# Patient Record
Sex: Female | Born: 1980 | Race: White | Hispanic: No | Marital: Single | Smoking: Current some day smoker
Health system: Southern US, Community
[De-identification: ages and names within clinical notes are randomized; demographics above are authoritative.]

## PROBLEM LIST (undated history)

## (undated) DIAGNOSIS — Z889 Allergy status to unspecified drugs, medicaments and biological substances status: Secondary | ICD-10-CM

## (undated) DIAGNOSIS — G43909 Migraine, unspecified, not intractable, without status migrainosus: Secondary | ICD-10-CM

## (undated) DIAGNOSIS — Z8659 Personal history of other mental and behavioral disorders: Secondary | ICD-10-CM

## (undated) DIAGNOSIS — F419 Anxiety disorder, unspecified: Secondary | ICD-10-CM

## (undated) DIAGNOSIS — F32A Depression, unspecified: Secondary | ICD-10-CM

## (undated) DIAGNOSIS — F329 Major depressive disorder, single episode, unspecified: Secondary | ICD-10-CM

## (undated) HISTORY — DX: Migraine, unspecified, not intractable, without status migrainosus: G43.909

## (undated) HISTORY — DX: Allergy status to unspecified drugs, medicaments and biological substances: Z88.9

## (undated) HISTORY — DX: Major depressive disorder, single episode, unspecified: F32.9

## (undated) HISTORY — DX: Anxiety disorder, unspecified: F41.9

## (undated) HISTORY — PX: OTHER SURGICAL HISTORY: SHX169

## (undated) HISTORY — DX: Personal history of other mental and behavioral disorders: Z86.59

## (undated) HISTORY — DX: Depression, unspecified: F32.A

---
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
## 2001-06-03 ENCOUNTER — Other Ambulatory Visit: Admission: RE | Admit: 2001-06-03 | Discharge: 2001-06-03 | Payer: Self-pay | Admitting: Gynecology

## 2002-07-05 ENCOUNTER — Other Ambulatory Visit: Admission: RE | Admit: 2002-07-05 | Discharge: 2002-07-05 | Payer: Self-pay | Admitting: Gynecology

## 2003-08-09 ENCOUNTER — Other Ambulatory Visit: Admission: RE | Admit: 2003-08-09 | Discharge: 2003-08-09 | Payer: Self-pay | Admitting: Obstetrics and Gynecology

## 2006-09-16 ENCOUNTER — Other Ambulatory Visit: Admission: RE | Admit: 2006-09-16 | Discharge: 2006-09-16 | Payer: Self-pay | Admitting: Obstetrics and Gynecology

## 2009-10-03 ENCOUNTER — Emergency Department (HOSPITAL_COMMUNITY): Admission: EM | Admit: 2009-10-03 | Discharge: 2009-10-03 | Payer: Self-pay | Admitting: Emergency Medicine

## 2010-06-01 LAB — CBC
HCT: 39.8 % (ref 36.0–46.0)
Hemoglobin: 13.2 g/dL (ref 12.0–15.0)
MCV: 86.9 fL (ref 78.0–100.0)
RDW: 13.5 % (ref 11.5–15.5)
WBC: 7.4 10*3/uL (ref 4.0–10.5)

## 2010-06-01 LAB — URINALYSIS, ROUTINE W REFLEX MICROSCOPIC
Bilirubin Urine: NEGATIVE
Hgb urine dipstick: NEGATIVE
Protein, ur: NEGATIVE mg/dL
Specific Gravity, Urine: 1.018 (ref 1.005–1.030)
Urobilinogen, UA: 0.2 mg/dL (ref 0.0–1.0)

## 2010-06-01 LAB — COMPREHENSIVE METABOLIC PANEL
Alkaline Phosphatase: 75 U/L (ref 39–117)
BUN: 12 mg/dL (ref 6–23)
CO2: 24 mEq/L (ref 19–32)
GFR calc non Af Amer: >60 mL/min (ref >60)
Glucose, Bld: 84 mg/dL (ref 70–99)
Potassium: 3.8 mEq/L (ref 3.5–5.1)
Total Bilirubin: 0.3 mg/dL (ref 0.3–1.2)
Total Protein: 7.1 g/dL (ref 6.0–8.3)

## 2010-06-01 LAB — DIFFERENTIAL
Basophils Absolute: 0 10*3/uL (ref 0.0–0.1)
Basophils Relative: 1 % (ref 0–1)
Eosinophils Absolute: 0.2 10*3/uL (ref 0.0–0.7)
Monocytes Relative: 6 % (ref 3–12)
Neutro Abs: 4.6 10*3/uL (ref 1.7–7.7)
Neutrophils Relative %: 62 % (ref 43–77)

## 2010-06-01 LAB — GC/CHLAMYDIA PROBE AMP, GENITAL
Chlamydia, DNA Probe: NEGATIVE
GC Probe Amp, Genital: NEGATIVE

## 2013-07-18 ENCOUNTER — Ambulatory Visit (INDEPENDENT_AMBULATORY_CARE_PROVIDER_SITE_OTHER): Payer: PRIVATE HEALTH INSURANCE | Admitting: Emergency Medicine

## 2013-07-18 VITALS — BP 110/78 | HR 104 | Temp 98.0°F | Ht 63.0 in | Wt 185.6 lb

## 2013-07-18 DIAGNOSIS — S139XXA Sprain of joints and ligaments of unspecified parts of neck, initial encounter: Secondary | ICD-10-CM

## 2013-07-18 DIAGNOSIS — R1011 Right upper quadrant pain: Secondary | ICD-10-CM

## 2013-07-18 MED ORDER — CYCLOBENZAPRINE HCL 10 MG PO TABS: 10.0000 mg | ORAL_TABLET | Freq: Three times a day (TID) | ORAL | Status: DC | PRN

## 2013-07-18 MED ORDER — NAPROXEN SODIUM 550 MG PO TABS: 550.0000 mg | ORAL_TABLET | Freq: Two times a day (BID) | ORAL | Status: DC

## 2013-07-18 NOTE — Patient Instructions (Signed)
Cholecystitis Cholecystitis is an inflammation of your gallbladder. It is usually caused by a buildup of gallstones or sludge (cholelithiasis) in your gallbladder. The gallbladder stores a fluid that helps digest fats (bile). Cholecystitis is serious and needs treatment right away.  CAUSES   Gallstones. Gallstones can block the tube that leads to your gallbladder, causing bile to build up. As bile builds up, the gallbladder becomes inflamed.  Bile duct problems, such as blockage from scarring or kinking.  Tumors. Tumors can stop bile from leaving your gallbladder correctly, causing bile to build up. As bile builds up, the gallbladder becomes inflamed. SYMPTOMS   Nausea.  Vomiting.  Abdominal pain, especially in the upper right area of your abdomen.  Abdominal tenderness or bloating.  Sweating.  Chills.  Fever.  Yellowing of the skin and the whites of the eyes (jaundice). DIAGNOSIS  Your caregiver may order blood tests to look for infection or gallbladder problems. Your caregiver may also order imaging tests, such as an ultrasound or computed tomography (CT) scan. Further tests may include a hepatobiliary iminodiacetic acid (HIDA) scan. This scan allows your caregiver to see your bile move from the liver to the gallbladder and to the small intestine. TREATMENT  A hospital stay is usually necessary to lessen the inflammation of your gallbladder. You may be required to not eat or drink (fast) for a certain amount of time. You may be given medicine to treat pain or an antibiotic medicine to treat an infection. Surgery may be needed to remove your gallbladder (cholecystectomy) once the inflammation has gone down. Surgery may be needed right away if you develop complications such as death of gallbladder tissue (gangrene) or a tear (perforation) of the gallbladder.  HOME CARE INSTRUCTIONS  Home care will depend on your treatment. In general:  If you were given antibiotics, take them as  directed. Finish them even if you start to feel better.  Only take over-the-counter or prescription medicines for pain, discomfort, or fever as directed by your caregiver.  Follow a low-fat diet until you see your caregiver again.  Keep all follow-up visits as directed by your caregiver. SEEK IMMEDIATE MEDICAL CARE IF:   Your pain is increasing and not controlled by medicines.  Your pain moves to another part of your abdomen or to your back.  You have a fever.  You have nausea and vomiting. MAKE SURE YOU:  Understand these instructions.  Will watch your condition.  Will get help right away if you are not doing well or get worse. Document Released: 03/03/2005 Document Revised: 05/26/2011 Document Reviewed: 01/17/2011 [facility] Patient Information 2014 [place], [place]. Cervical Sprain A cervical sprain is an injury in the neck in which the strong, fibrous tissues (ligaments) that connect your neck bones stretch or tear. Cervical sprains can range from mild to severe. Severe cervical sprains can cause the neck vertebrae to be unstable. This can lead to damage of the spinal cord and can result in serious nervous system problems. The amount of time it takes for a cervical sprain to get better depends on the cause and extent of the injury. Most cervical sprains heal in 1 to 3 weeks. CAUSES  Severe cervical sprains may be caused by:   Contact sport injuries (such as from football, rugby, wrestling, hockey, auto racing, gymnastics, diving, martial arts, or boxing).   Motor vehicle collisions.   Whiplash injuries. This is an injury from a sudden forward-and backward whipping movement of the head and neck.  Falls.  Mild cervical  sprains may be caused by:   Being in an awkward position, such as while cradling a telephone between your ear and shoulder.   Sitting in a chair that does not offer proper support.   Working at a poorly Marketing executivedesigned computer station.   Looking up or down  for long periods of time.  SYMPTOMS   Pain, soreness, stiffness, or a burning sensation in the front, back, or sides of the neck. This discomfort may develop immediately after the injury or slowly, 24 hours or more after the injury.   Pain or tenderness directly in the middle of the back of the neck.   Shoulder or upper back pain.   Limited ability to move the neck.   Headache.   Dizziness.   Weakness, numbness, or tingling in the hands or arms.   Muscle spasms.   Difficulty swallowing or chewing.   Tenderness and swelling of the neck.  DIAGNOSIS  Most of the time your health care provider can diagnose a cervical sprain by taking your history and doing a physical exam. Your health care provider will ask about previous neck injuries and any known neck problems, such as arthritis in the neck. X-rays may be taken to find out if there are any other problems, such as with the bones of the neck. Other tests, such as a CT scan or MRI, may also be needed.  TREATMENT  Treatment depends on the severity of the cervical sprain. Mild sprains can be treated with rest, keeping the neck in place (immobilization), and pain medicines. Severe cervical sprains are immediately immobilized. Further treatment is done to help with pain, muscle spasms, and other symptoms and may include:  Medicines, such as pain relievers, numbing medicines, or muscle relaxants.   Physical therapy. This may involve stretching exercises, strengthening exercises, and posture training. Exercises and improved posture can help stabilize the neck, strengthen muscles, and help stop symptoms from returning.  HOME CARE INSTRUCTIONS   Put ice on the injured area.   Put ice in a plastic bag.   Place a towel between your skin and the bag.   Leave the ice on for 15 20 minutes, 3 4 times a day.   If your injury was severe, you may have been given a cervical collar to wear. A cervical collar is a two-piece collar  designed to keep your neck from moving while it heals.  Do not remove the collar unless instructed by your health care provider.  If you have long hair, keep it outside of the collar.  Ask your health care provider before making any adjustments to your collar. Minor adjustments may be required over time to improve comfort and reduce pressure on your chin or on the back of your head.  Ifyou are allowed to remove the collar for cleaning or bathing, follow your health care provider's instructions on how to do so safely.  Keep your collar clean by wiping it with mild soap and water and drying it completely. If the collar you have been given includes removable pads, remove them every 1 2 days and hand wash them with soap and water. Allow them to air dry. They should be completely dry before you wear them in the collar.  If you are allowed to remove the collar for cleaning and bathing, wash and dry the skin of your neck. Check your skin for irritation or sores. If you see any, tell your health care provider.  Do not drive while wearing the collar.  Only take over-the-counter or prescription medicines for pain, discomfort, or fever as directed by your health care provider.   Keep all follow-up appointments as directed by your health care provider.   Keep all physical therapy appointments as directed by your health care provider.   Make any needed adjustments to your workstation to promote good posture.   Avoid positions and activities that make your symptoms worse.   Warm up and stretch before being active to help prevent problems.  SEEK MEDICAL CARE IF:   Your pain is not controlled with medicine.   You are unable to decrease your pain medicine over time as planned.   Your activity level is not improving as expected.  SEEK IMMEDIATE MEDICAL CARE IF:   You develop any bleeding.  You develop stomach upset.  You have signs of an allergic reaction to your medicine.   Your  symptoms get worse.   You develop new, unexplained symptoms.   You have numbness, tingling, weakness, or paralysis in any part of your body.  MAKE SURE YOU:   Understand these instructions.  Will watch your condition.  Will get help right away if you are not doing well or get worse. Document Released: 12/29/2006 Document Revised: 12/22/2012 Document Reviewed: 09/08/2012 [facility] Patient Information 2014 [place], [place].
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
## 2013-07-22 ENCOUNTER — Ambulatory Visit
Admission: RE | Admit: 2013-07-22 | Discharge: 2013-07-22 | Disposition: A | Discharge status: Home / Self Care | Payer: No Typology Code available for payment source | Source: Ambulatory Visit | Attending: Emergency Medicine | Admitting: Emergency Medicine

## 2013-07-22 DIAGNOSIS — R1011 Right upper quadrant pain: Secondary | ICD-10-CM

## 2013-07-26 ENCOUNTER — Other Ambulatory Visit: Payer: Self-pay | Admitting: Emergency Medicine

## 2013-07-26 DIAGNOSIS — R1011 Right upper quadrant pain: Secondary | ICD-10-CM

## 2013-08-04 ENCOUNTER — Encounter (HOSPITAL_COMMUNITY)
Admission: RE | Admit: 2013-08-04 | Discharge: 2013-08-04 | Disposition: A | Discharge status: Home / Self Care | Payer: No Typology Code available for payment source | Source: Ambulatory Visit | Attending: Emergency Medicine | Admitting: Emergency Medicine

## 2013-08-04 ENCOUNTER — Other Ambulatory Visit: Payer: Self-pay | Admitting: Emergency Medicine

## 2013-08-04 DIAGNOSIS — R1012 Left upper quadrant pain: Secondary | ICD-10-CM

## 2013-08-04 DIAGNOSIS — R1011 Right upper quadrant pain: Secondary | ICD-10-CM | POA: Insufficient documentation

## 2013-08-04 MED ORDER — SINCALIDE 5 MCG IJ SOLR
INTRAMUSCULAR | Status: AC
  Filled 2013-08-04: qty 5

## 2013-08-04 MED ORDER — STERILE WATER FOR INJECTION IJ SOLN
INTRAMUSCULAR | Status: AC
  Filled 2013-08-04: qty 10

## 2013-08-04 MED ORDER — SINCALIDE 5 MCG IJ SOLR
0.0200 ug/kg | Freq: Once | INTRAMUSCULAR | Status: AC
  Administered 2013-08-04: 1.68 ug via INTRAVENOUS

## 2013-08-04 MED ORDER — TECHNETIUM TC 99M MEBROFENIN IV KIT
5.0000 | PACK | Freq: Once | INTRAVENOUS | Status: AC | PRN
  Administered 2013-08-04: 5 via INTRAVENOUS

## 2013-08-10 ENCOUNTER — Ambulatory Visit (INDEPENDENT_AMBULATORY_CARE_PROVIDER_SITE_OTHER): Payer: PRIVATE HEALTH INSURANCE | Admitting: Family Medicine

## 2013-08-10 ENCOUNTER — Telehealth: Payer: Self-pay

## 2013-08-10 VITALS — BP 124/76 | HR 88 | Temp 99.4°F | Resp 16 | Ht 63.0 in | Wt 187.0 lb

## 2013-08-10 DIAGNOSIS — M25532 Pain in left wrist: Secondary | ICD-10-CM

## 2013-08-10 DIAGNOSIS — M25539 Pain in unspecified wrist: Secondary | ICD-10-CM

## 2013-08-10 NOTE — Progress Notes (Signed)
   Subjective:    Patient ID: Angela Price, female    DOB: 30-Oct-1980, 33 y.o.   MRN: [id number]  HPI Patient presents with left arm pain. Had IV last Thursday for NM hepatobiliary scan. Took 6 tries to get IV in. Felt like there was a hook in left hand the whole time the IV was in. During infusion it felt like fire up her arm. Arm still feels painful and cold. After procedure and IV removal there was a "lump" under her skin. Still having pain on dorsum of pain and wrist. Veins still will bulge. Dorsal forearm feels cold. Still lump on dorsal hand and wrist.   Patient works as Teacher, adult education.  Review of Systems  All other systems reviewed and are negative.      Objective:   Physical Exam  Constitutional: She is oriented to person, place, and time. She appears well-developed and well-nourished. No distress.  HENT:  Head: Normocephalic and atraumatic.  Eyes: Conjunctivae are normal. No scleral icterus.  Cardiovascular: Normal rate.   Pulmonary/Chest: Effort normal.  Musculoskeletal:       Left wrist: She exhibits tenderness. She exhibits normal range of motion, no bony tenderness, no swelling, no effusion and no deformity.  Neurological: She is alert and oriented to person, place, and time.  Skin: Skin is warm and dry. She is not diaphoretic.  Psychiatric: She has a normal mood and affect. Her behavior is normal.  Left wrist: FROM. 5/5 Strength radial, ulnar dev, flex, exten. Mild TTP dorsal hand. Moderate TTP dorsal wrist area of radioulnar joint. Mild TTP dorsal forearm. Normal grip strength. No swelling, echymosis     Assessment & Plan:  #1. Left wrist pain - Unclear etiology, may be related to IV trauma vs. Heavy work as Teacher, adult education - No risk for bony trauma so will hold off on xray at this time - No sign of swelling or thrombophlebitis - Wrist brace - Ibuprofen 600 mg tid x 5 days - Ice bucket soaks 10 min tid - Out of work until Monday for rest - F/u Tues if still  having pain Monday, would consider xray at that time.

## 2013-08-10 NOTE — Telephone Encounter (Signed)
LM for pt- advised her to come into the office to be evaluated or call the cardiologist office where the IV was placed.

## 2013-08-10 NOTE — Patient Instructions (Signed)
Thank you for coming in today  Out of work until Monday. Rest hand and wrist Wear wrist brace until then Ice bucket soaks 10 minutes 3x per day Ibuprofen 600 mg (3 tablets) 3x per day for 5 days then as needed  Followup Tuesday if not improved  Wrist Pain Wrist injuries are frequent in adults and children. A sprain is an injury to the ligaments that hold your bones together. A strain is an injury to muscle or muscle cord-like structures (tendons) from stretching or pulling. Generally, when wrists are moderately tender to touch following a fall or injury, a break in the bone (fracture) may be present. Most wrist sprains or strains are better in 3 to 5 days, but complete healing may take several weeks. HOME CARE INSTRUCTIONS   Put ice on the injured area.  Put ice in a plastic bag.  Place a towel between your skin and the bag.  Leave the ice on for 15-20 minutes, 03-04 times a day, for the first 2 days.  Keep your arm raised above the level of your heart whenever possible to reduce swelling and pain.  Rest the injured area for at least 48 hours or as directed by your caregiver.  If a splint or elastic bandage has been applied, use it for as long as directed by your caregiver or until seen by a caregiver for a follow-up exam.  Only take over-the-counter or prescription medicines for pain, discomfort, or fever as directed by your caregiver.  Keep all follow-up appointments. You may need to follow up with a specialist or have follow-up X-rays. Improvement in pain level is not a guarantee that you did not fracture a bone in your wrist. The only way to determine whether or not you have a broken bone is by X-ray. SEEK IMMEDIATE MEDICAL CARE IF:   Your fingers are swollen, very red, white, or cold and blue.  Your fingers are numb or tingling.  You have increasing pain.  You have difficulty moving your fingers. MAKE SURE YOU:   Understand these instructions.  Will watch your  condition.  Will get help right away if you are not doing well or get worse. Document Released: 12/11/2004 Document Revised: 05/26/2011 Document Reviewed: 04/24/2010 [facility] Patient Information 2014 [place], [place].

## 2013-08-10 NOTE — Telephone Encounter (Signed)
Patient states that her wrist is hurting really bad from where the IV was placed during her nuclear medicine scan. Patient states she can hardly move her wrist and wants to know what she can do about this. Please return call and advise.

## 2013-08-11 NOTE — Telephone Encounter (Signed)
Pt advised to come in and be evaluated. She states she will call cardiologist office.

## 2013-08-12 ENCOUNTER — Ambulatory Visit (INDEPENDENT_AMBULATORY_CARE_PROVIDER_SITE_OTHER): Payer: No Typology Code available for payment source | Admitting: Surgery

## 2013-10-10 ENCOUNTER — Ambulatory Visit (INDEPENDENT_AMBULATORY_CARE_PROVIDER_SITE_OTHER): Payer: PRIVATE HEALTH INSURANCE | Admitting: Internal Medicine

## 2013-10-10 VITALS — BP 110/78 | HR 93 | Temp 98.4°F | Resp 18 | Ht 64.0 in | Wt 188.8 lb

## 2013-10-10 DIAGNOSIS — R110 Nausea: Secondary | ICD-10-CM

## 2013-10-10 DIAGNOSIS — G43909 Migraine, unspecified, not intractable, without status migrainosus: Secondary | ICD-10-CM

## 2013-10-10 DIAGNOSIS — F411 Generalized anxiety disorder: Secondary | ICD-10-CM

## 2013-10-10 DIAGNOSIS — F4310 Post-traumatic stress disorder, unspecified: Secondary | ICD-10-CM

## 2013-10-10 MED ORDER — FLUOXETINE HCL 20 MG PO TABS: 20.0000 mg | ORAL_TABLET | Freq: Every day | ORAL | Status: DC

## 2013-10-10 MED ORDER — SUMATRIPTAN SUCCINATE 100 MG PO TABS: 100.0000 mg | ORAL_TABLET | ORAL | Status: DC | PRN

## 2013-10-10 MED ORDER — PROMETHAZINE HCL 25 MG PO TABS: 25.0000 mg | ORAL_TABLET | Freq: Three times a day (TID) | ORAL | Status: DC | PRN

## 2013-10-10 MED ORDER — KETOROLAC TROMETHAMINE 60 MG/2ML IM SOLN
60.0000 mg | Freq: Once | INTRAMUSCULAR | Status: AC
  Administered 2013-10-10: 60 mg via INTRAMUSCULAR

## 2013-10-10 MED ORDER — CLONAZEPAM 0.5 MG PO TABS: 0.5000 mg | ORAL_TABLET | Freq: Every day | ORAL | Status: DC | PRN

## 2013-10-10 NOTE — Progress Notes (Signed)
   Subjective:    Patient ID: Angela Price, female    DOB: 12/09/1980, 33 y.o.   MRN: [id number]  HPI 33 year old female presents for evaluation of several concerns  #1) Headache x 2 days. Symptoms started last night - located on left temporal/frontal region. Admits to tension in her neck. Also has a hx of migraine headaches with aura. She has had some nausea and photophobia with this HA. Has taken ibuprofen which has helped.  Denies dizziness or syncope.   #2) Anxiety and PTSD. Has been off her medications x 6-7 years due to lack of insurance. She has previously been on wellbutrin, prozac, and klonopin prn.  States that her "trigger day" for her PTSD and that through the summer she has increasing anxiety.  Now that she has insurance, would like to start these medications again. Reports she uses klonopin only as needed for "severe anxiety."  Reports suicidal ideation and hx of self harm but no intent to go through with suicidal plans.    Review of Systems  Constitutional: Negative for fever and chills.  Eyes: Positive for photophobia.  Gastrointestinal: Positive for nausea. Negative for vomiting and abdominal pain.  Neurological: Positive for headaches. Negative for dizziness and syncope.       Objective:   Physical Exam  Constitutional: She is oriented to person, place, and time. She appears well-developed and well-nourished.  HENT:  Head: Normocephalic and atraumatic.  Right Ear: External ear normal.  Left Ear: External ear normal.  Eyes: Conjunctivae are normal.  Neck: Normal range of motion.  Cardiovascular: Normal rate, regular rhythm and normal heart sounds.   Pulmonary/Chest: Effort normal and breath sounds normal.  Musculoskeletal:       Cervical back: She exhibits decreased range of motion and spasm (right trapezius, +TTP). She exhibits no bony tenderness.  Neurological: She is alert and oriented to person, place, and time.  Psychiatric: She has a normal mood and affect. Her  behavior is normal. Judgment and thought content normal.          Assessment & Plan:  Migraine, unspecified, without mention of intractable migraine without mention of status migrainosus - Plan: ketorolac (TORADOL) injection 60 mg, SUMAtriptan (IMITREX) 100 MG tablet  Anxiety state, unspecified - Plan: FLUoxetine (PROZAC) 20 MG tablet, clonazePAM (KLONOPIN) 0.5 MG tablet  PTSD (post-traumatic stress disorder) - Plan: FLUoxetine (PROZAC) 20 MG tablet, clonazePAM (KLONOPIN) 0.5 MG tablet  Nausea alone - Plan: promethazine (PHENERGAN) 25 MG tablet  Toradol 60 mg IM today. Rx for phenergan and imitrex to take if headache persists.  Plan to re-start Prozac 20 mg and Klonopin 0.5 mg daily prn anxiety.  Recheck with Dr. Merla Richesoolittle in 1 month, sooner if needed.   Discussed with Dr. Doolittle// I have completed the patient encounter in its entirety as documented by [facility] Tucker Minter, with editing by me where necessary. Robert P. Merla Richesoolittle, M.D.

## 2013-10-11 NOTE — Progress Notes (Signed)
Sent message to Dr Merla Richesoolittle to make sure this is ok to schedule with him since patient has never seen him here before.

## 2013-10-12 NOTE — Progress Notes (Signed)
OK to make appointment with Dr Merla Richesoolittle

## 2013-10-25 NOTE — Progress Notes (Signed)
Patient is only available to be seen Thursday through Sunday.  I informed her that she would need to follow up with you at the walk in center and she stated that was ok.  She will call when it is closer to her follow up date to make sure when you will be here.  Thank you.

## 2013-11-23 ENCOUNTER — Ambulatory Visit (INDEPENDENT_AMBULATORY_CARE_PROVIDER_SITE_OTHER): Payer: PRIVATE HEALTH INSURANCE | Admitting: Family Medicine

## 2013-11-23 VITALS — BP 130/82 | HR 93 | Temp 97.9°F | Resp 15 | Ht 63.5 in | Wt 189.2 lb

## 2013-11-23 DIAGNOSIS — F4310 Post-traumatic stress disorder, unspecified: Secondary | ICD-10-CM

## 2013-11-23 DIAGNOSIS — G43911 Migraine, unspecified, intractable, with status migrainosus: Secondary | ICD-10-CM

## 2013-11-23 DIAGNOSIS — S139XXA Sprain of joints and ligaments of unspecified parts of neck, initial encounter: Secondary | ICD-10-CM

## 2013-11-23 DIAGNOSIS — F411 Generalized anxiety disorder: Secondary | ICD-10-CM

## 2013-11-23 MED ORDER — CYCLOBENZAPRINE HCL 10 MG PO TABS: 10.0000 mg | ORAL_TABLET | Freq: Three times a day (TID) | ORAL | Status: DC | PRN

## 2013-11-23 MED ORDER — FLUOXETINE HCL 20 MG PO TABS: ORAL_TABLET | ORAL | Status: DC

## 2013-11-23 MED ORDER — KETOROLAC TROMETHAMINE 60 MG/2ML IM SOLN
60.0000 mg | Freq: Once | INTRAMUSCULAR | Status: AC
  Administered 2013-11-23: 60 mg via INTRAMUSCULAR

## 2013-11-23 MED ORDER — CLONAZEPAM 0.5 MG PO TABS: 0.5000 mg | ORAL_TABLET | Freq: Every day | ORAL | Status: DC | PRN

## 2013-11-23 MED ORDER — BUTALBITAL-APAP-CAFFEINE 50-325-40 MG PO TABS: 1.0000 | ORAL_TABLET | Freq: Four times a day (QID) | ORAL | Status: DC | PRN

## 2013-11-23 NOTE — Progress Notes (Signed)
 Chief Complaint:  Chief Complaint  Patient presents with  . Headache    refills needed today. sore throat, skin hurts, nausea.  she stated that they found black mold in her office on monday.  lymph node in her rigth breast is very painful.     HPI: Angela Price is a 33 y.o. female who is here for  HA is going to a migraine, she took phenergann was at work, she took an imitrex, Dr Laney Pastor had given her that to try and it did not help. She got through work and she had a 1 hr break and she took a nap and she felt baf, and she state she has redness in the back of her throat. One othe lymphnodes in her right breast was "screaming" after a 90 miun masseage. He is a Interior and spatial designer. She has never had this before.  SHe has never taken the imitrez before and this was the first time, she has had ohneergen abefore without. Works at [employer], found mold at work.   She has been at [employer] at January, the longer she was there the  More ferequent. 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  Last OV from [facility]) Headache x 2 days. Symptoms started last night - located on left temporal/frontal region. Admits to tension in her neck. Also has a hx of migraine headaches with aura. She has had some nausea and photophobia with this HA. Has taken ibuprofen which has helped.  Denies dizziness or syncope.  #2) Anxiety and PTSD. Has been off her medications x 6-7 years due to lack of insurance. She has previously been on wellbutrin, prozac, and klonopin prn. States that her "trigger day" for her PTSD and that through the summer she has increasing anxiety. Now that she has insurance, would like to start these medications again. Reports she uses klonopin only as needed for "severe anxiety." Reports suicidal ideation and hx of self harm but no intent to go through with suicidal plans.  Review of Systems    Constitutional: Negative for fever and chills.  Eyes: Positive for photophobia.  Gastrointestinal: Positive for nausea. Negative for vomiting and abdominal pain.  Neurological: Positive for headaches. Negative for dizziness and syncope.    Migraine, unspecified, without mention of intractable migraine without mention of status migrainosus - Plan: ketorolac (TORADOL) injection 60 mg, SUMAtriptan (IMITREX) 100 MG tablet  Anxiety state, unspecified - Plan: FLUoxetine (PROZAC) 20 MG tablet, clonazePAM (KLONOPIN) 0.5 MG tablet  PTSD (post-traumatic stress disorder) - Plan: FLUoxetine (PROZAC) 20 MG tablet, clonazePAM (KLONOPIN) 0.5 MG tablet  Nausea alone - Plan: promethazine (PHENERGAN) 25 MG tablet  Toradol 60 mg IM today. Rx for phenergan and imitrex to take if headache persists.  Plan to re-start Prozac 20 mg and Klonopin 0.5 mg daily prn anxiety.  Recheck with Dr. Laney Pastor in 1 month, sooner if needed.     Past Medical History  Diagnosis Date  . Multiple allergies   . Anxiety   . Depression    Past Surgical History  Procedure Laterality Date  . Wisdom tooth extraction  03/1999   History   Social History  . Marital Status: Single    Spouse Name: N/A    Number of Children: N/A  . Years of Education: N/A   Social History Main Topics  . Smoking status: Current Every Day Smoker -- 0.50  packs/day for 3 years    Types: Cigarettes  . Smokeless tobacco: Never Used  . Alcohol Use: Yes     Comment: 3 drinks per week  . Drug Use: No  . Sexual Activity: None   Other Topics Concern  . None   Social History Narrative  . None   Family History  Problem Relation Age of Onset  . Cancer Mother    No Known Allergies Prior to Admission medications   Medication Sig Start Date End Date Taking? Authorizing Provider  clonazePAM (KLONOPIN) 0.5 MG tablet Take 1 tablet (0.5 mg total) by mouth daily as needed for anxiety. 10/10/13  Yes Heather M Marte, PA-C  cyclobenzaprine (FLEXERIL) 10  MG tablet Take 1 tablet (10 mg total) by mouth 3 (three) times daily as needed for muscle spasms. 07/18/13  Yes Roselee Culver, MD  FLUoxetine (PROZAC) 20 MG tablet Take 1 tablet (20 mg total) by mouth daily. 10/10/13  Yes Heather M Marte, PA-C  ibuprofen (ADVIL,MOTRIN) 200 MG tablet Take 200 mg by mouth every 6 (six) hours as needed.   Yes Historical Provider, MD  medroxyPROGESTERone (DEPO-PROVERA) 150 MG/ML injection Inject 150 mg into the muscle every 3 (three) months.   Yes Historical Provider, MD  naproxen sodium (ANAPROX DS) 550 MG tablet Take 1 tablet (550 mg total) by mouth 2 (two) times daily with a meal. 07/18/13 07/18/14 Yes Roselee Culver, MD  promethazine (PHENERGAN) 25 MG tablet Take 1 tablet (25 mg total) by mouth every 8 (eight) hours as needed for nausea or vomiting. 10/10/13  Yes Collene Leyden, PA-C  SUMAtriptan (IMITREX) 100 MG tablet Take 1 tablet (100 mg total) by mouth every 2 (two) hours as needed for migraine or headache. May repeat in 2 hours if headache persists or recurs. 10/10/13  Yes Heather M Marte, PA-C     ROS: The patient denies fevers, chills, night sweats, unintentional weight loss, chest pain, palpitations, wheezing, dyspnea on exertion, nausea, vomiting, abdominal pain, dysuria, hematuria, melena, numbness, weakness, or tingling.  All other systems have been reviewed and were otherwise negative with the exception of those mentioned in the HPI and as above.    PHYSICAL EXAM: Filed Vitals:   11/23/13 1930  BP: 130/82  Pulse: 93  Temp: 97.9 F (36.6 C)  Resp: 15   Filed Vitals:   11/23/13 1930  Height: 5' 3.5" (1.613 m)  Weight: 189 lb 3.2 oz (85.821 kg)   Body mass index is 32.99 kg/(m^2).  General: Alert, no acute distress HEENT:  Normocephalic, atraumatic, oropharynx patent. EOMI, PERRLA Cardiovascular:  Regular rate and rhythm, no rubs murmurs or gallops.  No Carotid bruits, radial pulse intact. No pedal edema.  Respiratory: Clear to  auscultation bilaterally.  No wheezes, rales, or rhonchi.  No cyanosis, no use of accessory musculature GI: No organomegaly, abdomen is soft and non-tender, positive bowel sounds.  No masses. Skin: No rashes. Neurologic: Facial musculature symmetric. Psychiatric: Patient is appropriate throughout our interaction. Lymphatic: No cervical lymphadenopathy Musculoskeletal: Gait intact.   LABS: Results for orders placed during the hospital encounter of 10/03/09  GC/CHLAMYDIA PROBE AMP, GENITAL      Result Value Ref Range   GC Probe Amp, Genital    NEGATIVE   Value: NEGATIVE     (NOTE)  Testing performed using the BD ProbeTec Qx Chlamydia trachomatis and Neisseria gonorrhea amplified DNA assay.  Performed at:  [facility]               [address] [place], [place] [address]               [id number]   Chlamydia, DNA Probe    NEGATIVE   Value: NEGATIVE     (NOTE)  Testing performed using the BD ProbeTec Qx Chlamydia trachomatis and Neisseria gonorrhea amplified DNA assay.  Performed at:  [facility] [address]                    [place], [place] [address]               [id number]  URINALYSIS, ROUTINE W REFLEX MICROSCOPIC      Result Value Ref Range   Color, Urine YELLOW  YELLOW   APPearance CLEAR  CLEAR   Specific Gravity, Urine 1.018  1.005 - 1.030   pH 8.0  5.0 - 8.0   Glucose, UA NEGATIVE  NEGATIVE mg/dL   Hgb urine dipstick NEGATIVE  NEGATIVE   Bilirubin Urine NEGATIVE  NEGATIVE   Ketones, ur NEGATIVE  NEGATIVE mg/dL   Protein, ur NEGATIVE  NEGATIVE mg/dL   Urobilinogen, UA 0.2  0.0 - 1.0 mg/dL   Nitrite NEGATIVE  NEGATIVE   Leukocytes, UA    NEGATIVE   Value: NEGATIVE MICROSCOPIC NOT DONE ON URINES WITH NEGATIVE PROTEIN, BLOOD, LEUKOCYTES, NITRITE, OR GLUCOSE <1000 mg/dL.  PREGNANCY, URINE      Result Value Ref Range   Preg Test,  Ur       Value: NEGATIVE            THE SENSITIVITY OF THIS     METHODOLOGY IS >24 mIU/mL  CBC      Result Value Ref Range   WBC 7.4  4.0 - 10.5 K/uL   RBC 4.58  3.87 - 5.11 MIL/uL   Hemoglobin 13.2  12.0 - 15.0 g/dL   HCT 39.8  36.0 - 46.0 %   MCV 86.9  78.0 - 100.0 fL   MCH 28.9  26.0 - 34.0 pg   MCHC 33.2  30.0 - 36.0 g/dL   RDW 13.5  11.5 - 15.5 %   Platelets 236  150 - 400 K/uL  COMPREHENSIVE METABOLIC PANEL      Result Value Ref Range   Sodium 137  135 - 145 mEq/L   Potassium 3.8  3.5 - 5.1 mEq/L   Chloride 108  96 - 112 mEq/L   CO2 24  19 - 32 mEq/L   Glucose, Bld 84  70 - 99 mg/dL   BUN 12  6 - 23 mg/dL   Creatinine, Ser 0.66  0.4 - 1.2 mg/dL   Calcium 8.8  8.4 - 10.5 mg/dL   Total Protein 7.1  6.0 - 8.3 g/dL   Albumin 3.9  3.5 - 5.2 g/dL   AST 21  0 - 37 U/L   ALT 13  0 - 35 U/L   Alkaline Phosphatase 75  39 - 117 U/L   Total Bilirubin 0.3  0.3 - 1.2 mg/dL   GFR calc non Af Amer >60  >60 mL/min  GFR calc Af Amer    >60 mL/min   Value: >60            The eGFR has been calculated     using the MDRD equation.     This calculation has not been     validated in all clinical     situations.     eGFR's persistently     <60 mL/min signify     possible Chronic Kidney Disease.  DIFFERENTIAL      Result Value Ref Range   Neutrophils Relative % 62  43 - 77 %   Neutro Abs 4.6  1.7 - 7.7 K/uL   Lymphocytes Relative 30  12 - 46 %   Lymphs Abs 2.2  0.7 - 4.0 K/uL   Monocytes Relative 6  3 - 12 %   Monocytes Absolute 0.4  0.1 - 1.0 K/uL   Eosinophils Relative 2  0 - 5 %   Eosinophils Absolute 0.2  0.0 - 0.7 K/uL   Basophils Relative 1  0 - 1 %   Basophils Absolute 0.0  0.0 - 0.1 K/uL     EKG/XRAY:   Primary read interpreted by Dr. Marin Comment at [facility].   ASSESSMENT/PLAN: Encounter Diagnoses  Name Primary?  . Intractable migraine with status migrainosus, unspecified migraine type Yes  . Anxiety state, unspecified   . PTSD (post-traumatic stress disorder)   . Sprain  and strain    Toradol 6 mg IM x 1, rx fioricet Work note Refilled meds: Proazac but increase to 30 mg daily from 20 mg to help with Anxiety/PTSD , Klonopin F/u in 2 months   Gross sideeffects, risk and benefits, and alternatives of medications d/w patient. Patient is aware that all medications have potential sideeffects and we are unable to predict every sideeffect or drug-drug interaction that may occur.  , [place], DO 11/23/2013 8:19 PM

## 2013-12-19 ENCOUNTER — Ambulatory Visit (INDEPENDENT_AMBULATORY_CARE_PROVIDER_SITE_OTHER): Payer: PRIVATE HEALTH INSURANCE | Admitting: Family Medicine

## 2013-12-19 VITALS — BP 118/80 | HR 113 | Temp 98.9°F | Resp 18 | Ht 63.75 in | Wt 191.6 lb

## 2013-12-19 DIAGNOSIS — S56912A Strain of unspecified muscles, fascia and tendons at forearm level, left arm, initial encounter: Secondary | ICD-10-CM

## 2013-12-19 DIAGNOSIS — M79601 Pain in right arm: Secondary | ICD-10-CM

## 2013-12-19 MED ORDER — IBUPROFEN 800 MG PO TABS: 800.0000 mg | ORAL_TABLET | Freq: Three times a day (TID) | ORAL | Status: DC | PRN

## 2013-12-19 NOTE — Progress Notes (Signed)
   Subjective:    Patient ID: Angela Price, female    DOB: 08/13/1980, 33 y.o.   MRN: [id number]  HPI This is a pleasant 33 year old female who presents today with left forearm pain and numbness in her 1,2,3rd fingers. 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 She worked 3 full days and started to have bilateral wrist pain. She immobilized it over the weekend, took some ibuprofen with good relief and went back to work today. After 5 hours of work, she was in the middle of a deep tissue massage when she felt a sharp, "searing" pain in her left, medial forearm (she is left hand dominant). She has since had pain in the forearm as well as numbness and tingling in her 1,2,3 fingers and feels that her left hand is cooler than the right. She has hyperflexible joints and tries to be careful to avoid hyperflexing while working. 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 She was deeply flexing her wrist when she had pain today. She has not taken any medication for pain or applied ice or heat. She presented to clinic immediately after work today.  Review of Systems No previous injury, no fever, no chills, no bruising, may be slightly swollen, no weakness.     Objective:   Physical Exam  Vitals reviewed. Constitutional: She is oriented to person, place, and time. She appears well-developed and well-nourished.  HENT:  Head: Normocephalic and atraumatic.  Eyes: Conjunctivae are normal.  Neck: Normal range of motion. Neck supple.  Pulmonary/Chest: Effort normal.  Musculoskeletal: Normal range of motion.       Left forearm: She exhibits tenderness (slight tenderness of upper, medial forarm with wrist flexion.) and swelling (Left forearm slightly larger than right- difficult to tell whether this is due to it being her dominant hand.). She exhibits no bony tenderness, no edema, no deformity and no laceration.    Negative Thompson's. Left hand slightly cooler than right, some decreased sensation to touch on 1,2,3rd fingers. Normal color, normal cap refill, strong radial pulse. Wrist and elbow with full ROM, normal strength.   Neurological: She is alert and oriented to person, place, and time.  Skin: Skin is warm and dry.  Psychiatric: She has a normal mood and affect. Her behavior is normal. Judgment and thought content normal.      Assessment & Plan:  1. Forearm strain, left, initial encounter -Provided written and verbal information regarding diagnosis and treatment. -thumb spica wrist splint applied and instructions provided - ibuprofen (ADVIL,MOTRIN) 800 MG tablet; Take 1 tablet (800 mg total) by mouth every 8 (eight) hours as needed for moderate pain.  Dispense: 30 tablet; Refill: 1 - Ambulatory referral to Occupational Therapy- to discuss possible work modifications/ strengthening exercises to help her avoid injury.  Angela Belfasteborah B. Gessner, FNP-BC  [facility]  12/21/2013 10:18 PM

## 2013-12-19 NOTE — Patient Instructions (Signed)

## 2013-12-29 ENCOUNTER — Ambulatory Visit: Payer: No Typology Code available for payment source | Admitting: Occupational Therapy

## 2014-01-17 ENCOUNTER — Ambulatory Visit (INDEPENDENT_AMBULATORY_CARE_PROVIDER_SITE_OTHER): Payer: PRIVATE HEALTH INSURANCE | Admitting: Family Medicine

## 2014-01-17 VITALS — BP 110/80 | HR 102 | Temp 99.2°F | Resp 20 | Ht 63.75 in | Wt 193.0 lb

## 2014-01-17 DIAGNOSIS — J209 Acute bronchitis, unspecified: Secondary | ICD-10-CM

## 2014-01-17 DIAGNOSIS — J0100 Acute maxillary sinusitis, unspecified: Secondary | ICD-10-CM

## 2014-01-17 DIAGNOSIS — R5081 Fever presenting with conditions classified elsewhere: Secondary | ICD-10-CM

## 2014-01-17 DIAGNOSIS — R52 Pain, unspecified: Secondary | ICD-10-CM

## 2014-01-17 LAB — POCT INFLUENZA A/B
Influenza A, POC: NEGATIVE
Influenza B, POC: NEGATIVE

## 2014-01-17 MED ORDER — BENZONATATE 100 MG PO CAPS: 200.0000 mg | ORAL_CAPSULE | Freq: Two times a day (BID) | ORAL | Status: DC | PRN

## 2014-01-17 MED ORDER — AMOXICILLIN-POT CLAVULANATE 875-125 MG PO TABS: 1.0000 | ORAL_TABLET | Freq: Two times a day (BID) | ORAL | Status: DC

## 2014-01-17 MED ORDER — HYDROCODONE-HOMATROPINE 5-1.5 MG/5ML PO SYRP: 5.0000 mL | ORAL_SOLUTION | Freq: Every evening | ORAL | Status: DC | PRN

## 2014-01-17 MED ORDER — ALBUTEROL SULFATE HFA 108 (90 BASE) MCG/ACT IN AERS: 2.0000 | INHALATION_SPRAY | Freq: Four times a day (QID) | RESPIRATORY_TRACT | Status: DC | PRN

## 2014-01-17 NOTE — Patient Instructions (Signed)
Acute Bronchitis Bronchitis is inflammation of the airways that extend from the windpipe into the lungs (bronchi). The inflammation often causes mucus to develop. This leads to a cough, which is the most common symptom of bronchitis.  In acute bronchitis, the condition usually develops suddenly and goes away over time, usually in a couple weeks. Smoking, allergies, and asthma can make bronchitis worse. Repeated episodes of bronchitis may cause further lung problems.  CAUSES Acute bronchitis is most often caused by the same virus that causes a cold. The virus can spread from person to person (contagious) through coughing, sneezing, and touching contaminated objects. SIGNS AND SYMPTOMS   Cough.   Fever.   Coughing up mucus.   Body aches.   Chest congestion.   Chills.   Shortness of breath.   Sore throat.  DIAGNOSIS  Acute bronchitis is usually diagnosed through a physical exam. Your health care provider will also ask you questions about your medical history. Tests, such as chest X-rays, are sometimes done to rule out other conditions.  TREATMENT  Acute bronchitis usually goes away in a couple weeks. Oftentimes, no medical treatment is necessary. Medicines are sometimes given for relief of fever or cough. Antibiotic medicines are usually not needed but may be prescribed in certain situations. In some cases, an inhaler may be recommended to help reduce shortness of breath and control the cough. A cool mist vaporizer may also be used to help thin bronchial secretions and make it easier to clear the chest.  HOME CARE INSTRUCTIONS  Get plenty of rest.   Drink enough fluids to keep your urine clear or pale yellow (unless you have a medical condition that requires fluid restriction). Increasing fluids may help thin your respiratory secretions (sputum) and reduce chest congestion, and it will prevent dehydration.   Take medicines only as directed by your health care provider.  If  you were prescribed an antibiotic medicine, finish it all even if you start to feel better.  Avoid smoking and secondhand smoke. Exposure to cigarette smoke or irritating chemicals will make bronchitis worse. If you are a smoker, consider using nicotine gum or skin patches to help control withdrawal symptoms. Quitting smoking will help your lungs heal faster.   Reduce the chances of another bout of acute bronchitis by washing your hands frequently, avoiding people with cold symptoms, and trying not to touch your hands to your mouth, nose, or eyes.   Keep all follow-up visits as directed by your health care provider.  SEEK MEDICAL CARE IF: Your symptoms do not improve after 1 week of treatment.  SEEK IMMEDIATE MEDICAL CARE IF:  You develop an increased fever or chills.   You have chest pain.   You have severe shortness of breath.  You have bloody sputum.   You develop dehydration.  You faint or repeatedly feel like you are going to pass out.  You develop repeated vomiting.  You develop a severe headache. MAKE SURE YOU:   Understand these instructions.  Will watch your condition.  Will get help right away if you are not doing well or get worse. Document Released: 04/10/2004 Document Revised: 07/18/2013 Document Reviewed: 08/24/2012 ExitCare Patient Information 2015 [employer]. This information is not intended to replace advice given to you by your health care provider. Make sure you discuss any questions you have with your health care provider. Sinusitis Sinusitis is redness, soreness, and inflammation of the paranasal sinuses. Paranasal sinuses are air pockets within the bones of your face (beneath the   eyes, the middle of the forehead, or above the eyes). In healthy paranasal sinuses, mucus is able to drain out, and air is able to circulate through them by way of your nose. However, when your paranasal sinuses are inflamed, mucus and air can become trapped. This can  allow bacteria and other germs to grow and cause infection. Sinusitis can develop quickly and last only a short time (acute) or continue over a long period (chronic). Sinusitis that lasts for more than 12 weeks is considered chronic.  CAUSES  Causes of sinusitis include:  Allergies.  Structural abnormalities, such as displacement of the cartilage that separates your nostrils (deviated septum), which can decrease the air flow through your nose and sinuses and affect sinus drainage.  Functional abnormalities, such as when the small hairs (cilia) that line your sinuses and help remove mucus do not work properly or are not present. SIGNS AND SYMPTOMS  Symptoms of acute and chronic sinusitis are the same. The primary symptoms are pain and pressure around the affected sinuses. Other symptoms include:  Upper toothache.  Earache.  Headache.  Bad breath.  Decreased sense of smell and taste.  A cough, which worsens when you are lying flat.  Fatigue.  Fever.  Thick drainage from your nose, which often is green and may contain pus (purulent).  Swelling and warmth over the affected sinuses. DIAGNOSIS  Your health care provider will perform a physical exam. During the exam, your health care provider may:  Look in your nose for signs of abnormal growths in your nostrils (nasal polyps).  Tap over the affected sinus to check for signs of infection.  View the inside of your sinuses (endoscopy) using an imaging device that has a light attached (endoscope). If your health care provider suspects that you have chronic sinusitis, one or more of the following tests may be recommended:  Allergy tests.  Nasal culture. A sample of mucus is taken from your nose, sent to a lab, and screened for bacteria.  Nasal cytology. A sample of mucus is taken from your nose and examined by your health care provider to determine if your sinusitis is related to an allergy. TREATMENT  Most cases of acute  sinusitis are related to a viral infection and will resolve on their own within 10 days. Sometimes medicines are prescribed to help relieve symptoms (pain medicine, decongestants, nasal steroid sprays, or saline sprays).  However, for sinusitis related to a bacterial infection, your health care provider will prescribe antibiotic medicines. These are medicines that will help kill the bacteria causing the infection.  Rarely, sinusitis is caused by a fungal infection. In theses cases, your health care provider will prescribe antifungal medicine. For some cases of chronic sinusitis, surgery is needed. Generally, these are cases in which sinusitis recurs more than 3 times per year, despite other treatments. HOME CARE INSTRUCTIONS   Drink plenty of water. Water helps thin the mucus so your sinuses can drain more easily.  Use a humidifier.  Inhale steam 3 to 4 times a day (for example, sit in the bathroom with the shower running).  Apply a warm, moist washcloth to your face 3 to 4 times a day, or as directed by your health care provider.  Use saline nasal sprays to help moisten and clean your sinuses.  Take medicines only as directed by your health care provider.  If you were prescribed either an antibiotic or antifungal medicine, finish it all even if you start to feel better. SEEK IMMEDIATE MEDICAL   CARE IF:  You have increasing pain or severe headaches.  You have nausea, vomiting, or drowsiness.  You have swelling around your face.  You have vision problems.  You have a stiff neck.  You have difficulty breathing. MAKE SURE YOU:   Understand these instructions.  Will watch your condition.  Will get help right away if you are not doing well or get worse. Document Released: 03/03/2005 Document Revised: 07/18/2013 Document Reviewed: 03/18/2011 ExitCare Patient Information 2015 [employer]. This information is not intended to replace advice given to you by your health care provider.  Make sure you discuss any questions you have with your health care provider.  

## 2014-01-17 NOTE — Progress Notes (Signed)
 Chief Complaint:  Chief Complaint  Patient presents with  . Fever    cough, fever, sore throat and weakness with symptoms that started on sunday    HPI: Angela Price is a 33 y.o. female who is here for  3-4 day history Cough fever, chills, msk aches and also sinus pressure.  She has had body aches. She has taken tylenol with minimal releif . Fever was as 101.2 She was also has sore throat,weakness. She is a Technical brewermasseuse. She thinks she got it after she went to a Halloween party   Past Medical History  Diagnosis Date  . Multiple allergies   . Anxiety   . Depression    Past Surgical History  Procedure Laterality Date  . Wisdom tooth extraction  03/1999   History   Social History  . Marital Status: Single    Spouse Name: N/A    Number of Children: N/A  . Years of Education: N/A   Social History Main Topics  . Smoking status: Current Every Day Smoker -- 0.50 packs/day for 3 years    Types: Cigarettes  . Smokeless tobacco: Never Used  . Alcohol Use: 0.0 oz/week    0 Not specified per week     Comment: 3 drinks per week  . Drug Use: No  . Sexual Activity: None   Other Topics Concern  . None   Social History Narrative   Family History  Problem Relation Age of Onset  . Cancer Mother    No Known Allergies Prior to Admission medications   Medication Sig Start Date End Date Taking? Authorizing Provider  butalbital-acetaminophen-caffeine (FIORICET) 50-325-40 MG per tablet Take 1-2 tablets by mouth every 6 (six) hours as needed for headache. 11/23/13 11/23/14 Yes  P , DO  clonazePAM (KLONOPIN) 0.5 MG tablet Take 1 tablet (0.5 mg total) by mouth daily as needed for anxiety. 11/23/13  Yes  P , DO  cyclobenzaprine (FLEXERIL) 10 MG tablet Take 1 tablet (10 mg total) by mouth 3 (three) times daily as needed for muscle spasms. 11/23/13  Yes  P , DO  FLUoxetine (PROZAC) 20 MG tablet Take 1 1/2 tab PO daily ( total 30 mg) 11/23/13  Yes  P , DO  ibuprofen  (ADVIL,MOTRIN) 800 MG tablet Take 1 tablet (800 mg total) by mouth every 8 (eight) hours as needed for moderate pain. 12/19/13  Yes Emi Belfasteborah B Gessner, FNP  medroxyPROGESTERone (DEPO-PROVERA) 150 MG/ML injection Inject 150 mg into the muscle every 3 (three) months.   Yes Historical Provider, MD  naproxen sodium (ANAPROX DS) 550 MG tablet Take 1 tablet (550 mg total) by mouth 2 (two) times daily with a meal. 07/18/13 07/18/14 Yes Carmelina DaneJeffery S Anderson, MD  promethazine (PHENERGAN) 25 MG tablet Take 1 tablet (25 mg total) by mouth every 8 (eight) hours as needed for nausea or vomiting. 10/10/13  Yes Nelva NayHeather M Marte, PA-C     ROS: The patient denies  night sweats, unintentional weight loss, chest pain, palpitations, wheezing, dyspnea on exertion, nausea, vomiting, abdominal pain, dysuria, hematuria, melena, numbness,  or tingling.   All other systems have been reviewed and were otherwise negative with the exception of those mentioned in the HPI and as above.    PHYSICAL EXAM: Filed Vitals:   01/17/14 2009  BP: 110/80  Pulse: 102  Temp: 99.2 F (37.3 C)  Resp: 20   Filed Vitals:   01/17/14 2009  Height: 5' 3.75" (1.619 m)  Weight: 193 lb (87.544  kg)   Body mass index is 33.4 kg/(m^2).  General: Alert, no acute distress HEENT:  Normocephalic, atraumatic, oropharynx patent. EOMI, PERRLA.  TM nl, erythematous throat, No exudates. + boggy nares, + sinus tenderness. Cardiovascular:  Regular rate and rhythm, no rubs murmurs or gallops.  No Carotid bruits, radial pulse intact. No pedal edema.  Respiratory: Clear to auscultation bilaterally.  No wheezes, rales, or rhonchi.  No cyanosis, no use of accessory musculature GI: No organomegaly, abdomen is soft and non-tender, positive bowel sounds.  No masses. Skin: No rashes. Neurologic: Facial musculature symmetric. Psychiatric: Patient is appropriate throughout our interaction. Lymphatic: No cervical lymphadenopathy Musculoskeletal: Gait  intact.   LABS: Results for orders placed or performed in visit on 01/17/14  POCT Influenza A/B  Result Value Ref Range   Influenza A, POC Negative    Influenza B, POC Negative      EKG/XRAY:   [facility]   ASSESSMENT/PLAN: Encounter Diagnoses  Name Primary?  . Body aches Yes  . Fever presenting with conditions classified elsewhere   . Acute bronchitis, unspecified organism   . Acute maxillary sinusitis, recurrence not specified    Rx Augmentin, Tessalon perles, Hycodan, and also Albuterol prn F/u prn  Gross sideeffects, risk and benefits, and alternatives of medications d/w patient. Patient is aware that all medications have potential sideeffects and we are unable to predict every sideeffect or drug-drug interaction that may occur.  Hamilton CapriLE,  PHUONG, DO 01/17/2014 9:06 PM

## 2014-01-31 ENCOUNTER — Ambulatory Visit (INDEPENDENT_AMBULATORY_CARE_PROVIDER_SITE_OTHER): Payer: PRIVATE HEALTH INSURANCE | Admitting: Family Medicine

## 2014-01-31 VITALS — BP 110/78 | HR 95 | Temp 98.5°F | Resp 16 | Ht 64.0 in | Wt 194.1 lb

## 2014-01-31 DIAGNOSIS — R110 Nausea: Secondary | ICD-10-CM

## 2014-01-31 DIAGNOSIS — G43111 Migraine with aura, intractable, with status migrainosus: Secondary | ICD-10-CM

## 2014-01-31 DIAGNOSIS — H5319 Other subjective visual disturbances: Secondary | ICD-10-CM

## 2014-01-31 DIAGNOSIS — H53149 Visual discomfort, unspecified: Secondary | ICD-10-CM

## 2014-01-31 DIAGNOSIS — B379 Candidiasis, unspecified: Secondary | ICD-10-CM

## 2014-01-31 MED ORDER — NYSTATIN 100000 UNIT/GM EX CREA: 1.0000 "application " | TOPICAL_CREAM | Freq: Two times a day (BID) | CUTANEOUS | Status: DC

## 2014-01-31 MED ORDER — OXYCODONE-ACETAMINOPHEN 5-325 MG PO TABS: 1.0000 | ORAL_TABLET | Freq: Four times a day (QID) | ORAL | Status: DC | PRN

## 2014-01-31 MED ORDER — KETOROLAC TROMETHAMINE 60 MG/2ML IM SOLN
60.0000 mg | Freq: Once | INTRAMUSCULAR | Status: AC
  Administered 2014-01-31: 60 mg via INTRAMUSCULAR

## 2014-01-31 NOTE — Progress Notes (Signed)
Chief Complaint:  Chief Complaint  Patient presents with  . Migraine    HPI: Angela Price is a 33 y.o. female who is here for occipital HA, started today, took fioricet without relief.  She has light and noise sensitivity, wearing sun glasses She has pain on the right side from occiput radiating to temporal area. She feels like her temporal msk and jaw are hurting. Her typical HA , she usually gets just silent migraines with aura so this is different Migraine auruas but recently she has had pain as well  Just does not feel the same. Most HA are in the front or behind her eyes. Today it is in the occiput She is not usually this sensitivie to everyhting. Migraine does not ;t usually make her cry today she is tearing up, She has no sinus pressure or UR sxs  She has been chugging a lot of water. She ahs been able to eat but has not had appetitie  She denies any slurred speech, numbness or weakness aysmmetry of any kind She has some pain behind her right eye She has a pinching pain, severe and constant Sister with hx of brain lesiosn and now has deficit, she states the mass was srunken down with steroids.     Past Medical History  Diagnosis Date  . Multiple allergies   . Anxiety   . Depression    Past Surgical History  Procedure Laterality Date  . Wisdom tooth extraction  03/1999   History   Social History  . Marital Status: Single    Spouse Name: N/A    Number of Children: N/A  . Years of Education: N/A   Social History Main Topics  . Smoking status: Current Every Day Smoker -- 0.50 packs/day for 3 years    Types: Cigarettes  . Smokeless tobacco: Never Used  . Alcohol Use: 0.0 oz/week    0 Not specified per week     Comment: 3 drinks per week  . Drug Use: No  . Sexual Activity: None   Other Topics Concern  . None   Social History Narrative   Family History  Problem Relation Age of Onset  . Cancer Mother    Allergies  Allergen Reactions  . Imitrex  [Sumatriptan]    Prior to Admission medications   Medication Sig Start Date End Date Taking? Authorizing Provider  amoxicillin-clavulanate (AUGMENTIN) 875-125 MG per tablet Take 1 tablet by mouth 2 (two) times daily. 01/17/14  Yes Rooney Swails P Areej Tayler, DO  benzonatate (TESSALON) 100 MG capsule Take 2 capsules (200 mg total) by mouth 2 (two) times daily as needed. 01/17/14  Yes Jerah Esty P Logen Heintzelman, DO  butalbital-acetaminophen-caffeine (FIORICET) 50-325-40 MG per tablet Take 1-2 tablets by mouth every 6 (six) hours as needed for headache. 11/23/13 11/23/14 Yes Eisa Conaway P Darnetta Kesselman, DO  clonazePAM (KLONOPIN) 0.5 MG tablet Take 1 tablet (0.5 mg total) by mouth daily as needed for anxiety. 11/23/13  Yes Rachid Parham P Tekisha Darcey, DO  cyclobenzaprine (FLEXERIL) 10 MG tablet Take 1 tablet (10 mg total) by mouth 3 (three) times daily as needed for muscle spasms. 11/23/13  Yes [place] Welcome P Aliani Caccavale, DO  FLUoxetine (PROZAC) 20 MG tablet Take 1 1/2 tab PO daily ( total 30 mg) 11/23/13  Yes Aero Drummonds P Onelia Cadmus, DO  ibuprofen (ADVIL,MOTRIN) 800 MG tablet Take 1 tablet (800 mg total) by mouth every 8 (eight) hours as needed for moderate pain. 12/19/13  Yes Emi Belfasteborah B Gessner, FNP  medroxyPROGESTERone (DEPO-PROVERA) 150 MG/ML  injection Inject 150 mg into the muscle every 3 (three) months.   Yes Historical Provider, MD  promethazine (PHENERGAN) 25 MG tablet Take 1 tablet (25 mg total) by mouth every 8 (eight) hours as needed for nausea or vomiting. 10/10/13  Yes Nelva NayHeather M Marte, PA-C     ROS: The patient denies fevers, chills, night sweats, unintentional weight loss, chest pain, palpitations, wheezing, dyspnea on exertion,  abdominal pain, dysuria, hematuria, melena, numbness, weakness, or tingling.   All other systems have been reviewed and were otherwise negative with the exception of those mentioned in the HPI and as above.    PHYSICAL EXAM: Filed Vitals:   01/31/14 1800  BP: 110/78  Pulse: 95  Temp: 98.5 F (36.9 C)  Resp: 16   Filed Vitals:   01/31/14 1800  Height: 5\' 4"  (1.626  m)  Weight: 194 lb 2 oz (88.055 kg)   Body mass index is 33.31 kg/(m^2).  General: Alert, no acute distress HEENT:  Normocephalic, atraumatic, oropharynx patent. EOMI, PERRLA, fundo exam nl, has some light sensitivity , tm normal, no exudates Cardiovascular:  Regular rate and rhythm, no rubs murmurs or gallops.  No Carotid bruits, radial pulse intact. No pedal edema.  Respiratory: Clear to auscultation bilaterally.  No wheezes, rales, or rhonchi.  No cyanosis, no use of accessory musculature GI: No organomegaly, abdomen is soft and non-tender, positive bowel sounds.  No masses. Skin: No rashes. Neurologic: Facial musculature symmetric. CN 2-12 grossly nl Psychiatric: Patient is appropriate throughout our interaction. Lymphatic: No cervical lymphadenopathy Musculoskeletal: Gait intact. 5/5 stregnth, neg Romberg   LABS: Results for orders placed or performed in visit on 01/17/14  POCT Influenza A/B  Result Value Ref Range   Influenza A, POC Negative    Influenza B, POC Negative      EKG/XRAY:   [facility]   ASSESSMENT/PLAN: Encounter Diagnoses  Name Primary?  . Intractable migraine with aura with status migrainosus Yes  . Photophobia   . Nausea without vomiting   . Yeast infection    Rx nystatin cream  Toradol 60 mg IM x 1, push fluids at home Rx roxicet  For sever pain if toradol does not help sicne she had taken fioricet and did not help She has a rx for promethazine at home which she can take Advise that she can try a clonazepam to see if helps with msk aches in her neck and then also help with sleep, she can use this first if toradol does not relieve HA  And before she take roxicet.   Gross sideeffects, risk and benefits, and alternatives of medications d/w patient. Patient is aware that all medications have potential sideeffects and we are unable to predict every sideeffect or drug-drug interaction that may occur.  Jahmarion Popoff PHUONG,  DO 01/31/2014 6:30 PM

## 2014-01-31 NOTE — Patient Instructions (Signed)

## 2014-02-02 ENCOUNTER — Telehealth: Payer: Self-pay

## 2014-02-02 NOTE — Telephone Encounter (Signed)
Letter printed/signed and in drawer for pt.

## 2014-02-02 NOTE — Telephone Encounter (Signed)
Spoke with patient about her sxs, she is improved but percocet made her dizzy. No new sxs worrisome for any CVA/TIA, denies CP or palpitations or clamminess.  Advise not to take percocet anymore. She will cont with klonopin prn and rest. Work note extended. She can pick it up later in the week. I have written the note and it should be under the letter section. Please sign on my behalf.

## 2014-02-02 NOTE — Telephone Encounter (Signed)
Pt called and reported that she was in on 11/17 for the worst migraine she has ever had and was told she can try taking double her usual dose of Klonopin (which she did yesterday), and given Percocet to use if not better. She took a dose of Percocet last night (but no Klonopin) and is still feeling dizzy and "out of it" this morning. She states this is a normal reaction she has had in the past to any medication similar to Percocet and she usually avoids taking them. Pt reports her HA is better, but hasn't completely resolved and is still feeling disoriented from it. She stated that Dr Conley RollsLe had wanted her to stay out of work until better, but she talked Dr Conley RollsLe into letting her go back to work today. Now she is regretting it. I advised that she shouldn't drive/work if she is dizzy and disoriented, and advised again that we should re-check her if she is concerned about her SEs from medication. She stated that she has experienced these Sxs before and Dr Conley RollsLe only wanted her to return if her HA got worse and it has improved. Can we write her an OOW note for today?

## 2014-02-16 ENCOUNTER — Ambulatory Visit (INDEPENDENT_AMBULATORY_CARE_PROVIDER_SITE_OTHER): Payer: PRIVATE HEALTH INSURANCE | Admitting: Family Medicine

## 2014-02-16 VITALS — BP 126/80 | HR 98 | Temp 98.9°F | Resp 16 | Ht 63.5 in | Wt 194.4 lb

## 2014-02-16 DIAGNOSIS — M940 Chondrocostal junction syndrome [Tietze]: Secondary | ICD-10-CM

## 2014-02-16 DIAGNOSIS — G43109 Migraine with aura, not intractable, without status migrainosus: Secondary | ICD-10-CM

## 2014-02-16 MED ORDER — MELOXICAM 15 MG PO TABS: 15.0000 mg | ORAL_TABLET | Freq: Every day | ORAL | Status: DC

## 2014-02-16 MED ORDER — BUTALBITAL-APAP-CAFFEINE 50-325-40 MG PO TABS: 1.0000 | ORAL_TABLET | Freq: Four times a day (QID) | ORAL | Status: DC | PRN

## 2014-02-16 NOTE — Patient Instructions (Signed)
Return if pain worsens or if you develop nausea, vomiting, fever or chills Take mobic once a day until pain improves May take flexeril three times a day as needed

## 2014-02-16 NOTE — Progress Notes (Signed)
Subjective:    Patient ID: Angela Price, female    DOB: 01/23/1981, 33 y.o.   MRN: [id number]  HPI  This is a 33 year old female presenting with RUQ pain x 2 days. She describes the pain as constant and soreness. The pain is worsened with a deep breath. The pain is not associated with food, although eating a large meal is painful because it puts pressure on her abdomen. She has taken ibuprofen without relief. Applying heat gives her relief. She denies fever, chills, N/V/D. She was here 7 months ago for a similar pain. She was sent for a gallbladder HIDA scan abdominal ultrasound, both of which were normal. 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 She reports for the past week she has increased her hours slightly at work and has had "very complicated patients".   Pt has a history of migraines. She usually gets one migraine per week. She states in the past week she has had three. Fioricet works well for her. She is needing a refill today.  Review of Systems  Constitutional: Negative for fever and chills.  Respiratory: Negative for cough and shortness of breath.   Cardiovascular: Negative for chest pain.  Gastrointestinal: Positive for abdominal pain. Negative for nausea, vomiting and diarrhea.  Genitourinary: Negative for dysuria and hematuria.  Musculoskeletal: Negative for back pain.  Skin: Negative for color change and rash.  Neurological: Positive for headaches.    Patient Active Problem List   Diagnosis Date Noted  . Migraine 02/17/2014  . Neck pain 02/17/2014  . Anxiety state 02/17/2014  . Depression 02/17/2014   Prior to Admission medications   Medication Sig Start Date End Date Taking? Authorizing Provider  butalbital-acetaminophen-caffeine (FIORICET) 50-325-40 MG per tablet Take 1-2 tablets by mouth every 6 (six) hours as needed for headache. 02/16/14 02/16/15 Yes Lanier ClamNicole Bush V, PA-C  clonazePAM (KLONOPIN) 0.5 MG tablet Take 1 tablet (0.5 mg total) by mouth daily  as needed for anxiety. 11/23/13  Yes Thao P Le, DO  cyclobenzaprine (FLEXERIL) 10 MG tablet Take 1 tablet (10 mg total) by mouth 3 (three) times daily as needed for muscle spasms. 11/23/13  Yes Thao P Le, DO  FLUoxetine (PROZAC) 20 MG tablet Take 1 1/2 tab PO daily ( total 30 mg) 11/23/13  Yes Thao P Le, DO  ibuprofen (ADVIL,MOTRIN) 800 MG tablet Take 1 tablet (800 mg total) by mouth every 8 (eight) hours as needed for moderate pain. 12/19/13  Yes Emi Belfasteborah B Gessner, FNP  medroxyPROGESTERone (DEPO-PROVERA) 150 MG/ML injection Inject 150 mg into the muscle every 3 (three) months.   Yes Historical Provider, MD  promethazine (PHENERGAN) 25 MG tablet Take 1 tablet (25 mg total) by mouth every 8 (eight) hours as needed for nausea or vomiting. 10/10/13  Yes Nelva NayHeather M Marte, PA-C  meloxicam (MOBIC) 15 MG tablet Take 1 tablet (15 mg total) by mouth daily. 02/16/14   Dorna LeitzNicole Bush V, PA-C  oxyCODONE-acetaminophen (ROXICET) 5-325 MG per tablet Take 1 tablet by mouth every 6 (six) hours as needed for severe pain. Take with stool softener Patient not taking: Reported on 02/17/2014 01/31/14   Thao P Le, DO   Allergies  Allergen Reactions  . Imitrex [Sumatriptan]    Patient's social and family history were reviewed.     Objective:   Physical Exam  Constitutional: She is oriented to person, place, and time. She appears well-developed and well-nourished. No distress.  HENT:  Head: Normocephalic and atraumatic.  Right Ear: Hearing normal.  Left Ear: Hearing normal.  Nose: Nose normal.  Eyes: Conjunctivae and lids are normal. Right eye exhibits no discharge. Left eye exhibits no discharge. No scleral icterus.  Cardiovascular: Normal rate, regular rhythm, normal heart sounds, intact distal pulses and normal pulses.   No murmur heard. Pulmonary/Chest: Effort normal and breath sounds normal. No respiratory distress. She has no wheezes. She has no rhonchi. She has no rales. She exhibits tenderness (diffuse - bilateral  inferolateral ribs and sternum).  Abdominal: Soft. Normal appearance and bowel sounds are normal. There is tenderness (slight, RUQ -  pain more pronounced when palpating over ribs). There is no rebound, no guarding, no CVA tenderness and negative Murphy's sign.  Musculoskeletal: Normal range of motion.  Lymphadenopathy:    She has no cervical adenopathy.  Neurological: She is alert and oriented to person, place, and time.  Skin: Skin is warm, dry and intact. No lesion and no rash noted.  Psychiatric: She has a normal mood and affect. Her speech is normal and behavior is normal. Thought content normal.      Assessment & Plan:  1. Costochondritis Pt most likely has costochondritis. I am less worried about abdominal origin since she has had similar pain with normal abdominal ultrasound and HIDA. She has diffuse pain over her rib cage. Pt does not have fever, chills , N/V/D. I suggested we get blood work (CMP and CBC) to rule out other cause. Pt has severe fear of needles. We decided not to get blood work for now. She will use Mobic once a day. She has a refill of flexeril at the pharmacy that she will pick up and use. She will return if symptoms worsen or do not improve in 7-10 days. - meloxicam (MOBIC) 15 MG tablet; Take 1 tablet (15 mg total) by mouth daily.  Dispense: 30 tablet; Refill: 0  2. Migraine with aura and without status migrainosus, not intractable Fioricet refilled. - butalbital-acetaminophen-caffeine (FIORICET) 50-325-40 MG per tablet; Take 1-2 tablets by mouth every 6 (six) hours as needed for headache.  Dispense: 20 tablet; Refill: 1   Nicole V. Dyke BrackettBush, PA-C, [facility]  02/17/2014

## 2014-02-17 ENCOUNTER — Encounter: Payer: Self-pay | Admitting: Physician Assistant

## 2014-02-17 DIAGNOSIS — F32A Depression, unspecified: Secondary | ICD-10-CM | POA: Insufficient documentation

## 2014-02-17 DIAGNOSIS — F411 Generalized anxiety disorder: Secondary | ICD-10-CM | POA: Insufficient documentation

## 2014-02-17 DIAGNOSIS — G43909 Migraine, unspecified, not intractable, without status migrainosus: Secondary | ICD-10-CM | POA: Insufficient documentation

## 2014-02-17 DIAGNOSIS — F329 Major depressive disorder, single episode, unspecified: Secondary | ICD-10-CM | POA: Insufficient documentation

## 2014-02-17 DIAGNOSIS — M542 Cervicalgia: Secondary | ICD-10-CM | POA: Insufficient documentation

## 2014-02-27 NOTE — Progress Notes (Signed)
History and physical examinations reviewed with Nicole Bush, PA-C. Agree with assessment and plan. 

## 2014-03-18 ENCOUNTER — Ambulatory Visit (INDEPENDENT_AMBULATORY_CARE_PROVIDER_SITE_OTHER): Payer: No Typology Code available for payment source | Admitting: Family Medicine

## 2014-03-18 ENCOUNTER — Encounter: Payer: Self-pay | Admitting: *Deleted

## 2014-03-18 VITALS — BP 118/86 | HR 97 | Temp 98.2°F | Resp 18 | Ht 63.0 in | Wt 198.0 lb

## 2014-03-18 DIAGNOSIS — K219 Gastro-esophageal reflux disease without esophagitis: Secondary | ICD-10-CM

## 2014-03-18 DIAGNOSIS — G43009 Migraine without aura, not intractable, without status migrainosus: Secondary | ICD-10-CM

## 2014-03-18 DIAGNOSIS — M940 Chondrocostal junction syndrome [Tietze]: Secondary | ICD-10-CM

## 2014-03-18 MED ORDER — KETOROLAC TROMETHAMINE 60 MG/2ML IM SOLN
60.0000 mg | Freq: Once | INTRAMUSCULAR | Status: AC
  Administered 2014-03-18: 60 mg via INTRAMUSCULAR

## 2014-03-18 MED ORDER — ONDANSETRON 4 MG PO TBDP
4.0000 mg | ORAL_TABLET | Freq: Once | ORAL | Status: AC
  Administered 2014-03-18: 4 mg via ORAL

## 2014-03-18 MED ORDER — MELOXICAM 15 MG PO TABS: 15.0000 mg | ORAL_TABLET | Freq: Every day | ORAL | Status: DC

## 2014-03-18 NOTE — Progress Notes (Signed)
Subjective: 34 year old lady who has a long history of migraines, getting about 3 months recently. Starting yesterday she had this one. 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 She had a lot of nausea today and vomited once. She had taken a Fioricet at home early this morning. She does have Phenergan at home but did not take it due to the sedation side effect. She was given some meloxicam last time, and found that that helped. She is almost out of it. Please her medicine list. She does not smoke. She has been working more than usual of late. She also has a history of costochondritis and chest wall pains. Currently has been hurting on the left chest wall just below the breast area and lateral to it. She also has been hurting the right low chest wall and right upper quadrant of abdomen.  Objective:  TMs normal. Eyes have EOMs. Pupils react. Throat clear. Neck supple without significant nodes. Chest is clear to all station. Heart regular without murmurs. Chest wall is very tender there is described above. She also is tender in the right upper quadrant soft tissues of the abdomen. This is apparently been evaluated with gallbladder ultrasound and HIDA scan and nothing of concern is been found. She is tender in the epigastrium makes her feel like it goes up into her chest.  Assessment: Migraine Costochondritis Nausea and vomiting Possible GERD  Plan: Ondansetron 4 mg Tramadol 60 mg (ketorolac) Represcribed the meloxicam for when necessary use

## 2014-03-18 NOTE — Patient Instructions (Addendum)
Take caution with using the anti-inflammatory medication such as meloxicam, ibuprofen, naproxen, etc. They can cause stomach irritation.  Go home and try to rest. Phenergan might help you do this.  You will receive some Zofran (ondansetron) for nausea and some ketorolac (Toradol) for the pain.  If acutely worse at anytime return or go to the emergency room.  Try some over-the-counter Zantac, 75-150 mg twice daily. If you keep having problems with this we will consider putting her on something store.

## 2014-03-26 ENCOUNTER — Ambulatory Visit (INDEPENDENT_AMBULATORY_CARE_PROVIDER_SITE_OTHER): Payer: No Typology Code available for payment source | Admitting: Emergency Medicine

## 2014-03-26 VITALS — BP 118/70 | HR 121 | Temp 99.3°F | Resp 18 | Ht 63.0 in | Wt 195.6 lb

## 2014-03-26 DIAGNOSIS — J028 Acute pharyngitis due to other specified organisms: Secondary | ICD-10-CM

## 2014-03-26 DIAGNOSIS — R5081 Fever presenting with conditions classified elsewhere: Secondary | ICD-10-CM

## 2014-03-26 DIAGNOSIS — R059 Cough, unspecified: Secondary | ICD-10-CM

## 2014-03-26 DIAGNOSIS — R6883 Chills (without fever): Secondary | ICD-10-CM

## 2014-03-26 DIAGNOSIS — R52 Pain, unspecified: Secondary | ICD-10-CM

## 2014-03-26 DIAGNOSIS — R05 Cough: Secondary | ICD-10-CM

## 2014-03-26 LAB — POCT CBC
Granulocyte percent: 62.5 %G (ref 37–80)
HEMATOCRIT: 42.6 % (ref 37.7–47.9)
Hemoglobin: 13.7 g/dL (ref 12.2–16.2)
LYMPH, POC: 1.5 (ref 0.6–3.4)
MCH, POC: 28.4 pg (ref 27–31.2)
MCHC: 32.2 g/dL (ref 31.8–35.4)
MCV: 88.3 fL (ref 80–97)
MID (cbc): 0.3 (ref 0–0.9)
MPV: 8.1 fL (ref 0–99.8)
POC GRANULOCYTE: 3.1 (ref 2–6.9)
POC LYMPH PERCENT: 30.5 %L (ref 10–50)
POC MID %: 7 % (ref 0–12)
Platelet Count, POC: 228 10*3/uL (ref 142–424)
RBC: 4.82 M/uL (ref 4.04–5.48)
RDW, POC: 14.4 %
WBC: 5 10*3/uL (ref 4.6–10.2)

## 2014-03-26 LAB — POCT RAPID STREP A (OFFICE): Rapid Strep A Screen: NEGATIVE

## 2014-03-26 LAB — POCT INFLUENZA A/B
INFLUENZA B, POC: NEGATIVE
Influenza A, POC: NEGATIVE

## 2014-03-26 MED ORDER — OSELTAMIVIR PHOSPHATE 75 MG PO CAPS: 75.0000 mg | ORAL_CAPSULE | Freq: Two times a day (BID) | ORAL | Status: DC

## 2014-03-26 NOTE — Patient Instructions (Signed)

## 2014-03-26 NOTE — Progress Notes (Addendum)
Subjective:  This chart was scribed for Angela Lites, MD by Angela Price, ED Scribe at [facility].The patient was seen in exam room 03 and the patient's care was started at 11:19 AM.   Patient ID: Angela Price, female    DOB: 18-Jul-1980, 34 y.o.   MRN: [id number] Chief Complaint  Patient presents with  . Cough    since yesturday   . Sore Throat  . Chills  . Fever  . Generalized Body Aches   HPI  HPI Comments: Angela Price is a 34 y.o. female who presents to [facility] complaining of fever of a 102.5 onset yesterday. She has generalized body aches, chills, sore throat, HA, pain with breathing, productive cough, congestion. Pt reports her throat feels like there is "some pressure on the right". Her cough is producing a yellow sputum. She took meloxicam  for relief yesterday. Pt has not taken anything today for relief. Pt is a massage therapist. She denies nausea, vomiting, diarrhea, ear pain. No chance of pregnancy.    Patient Active Problem List   Diagnosis Date Noted  . Migraine 02/17/2014  . Neck pain 02/17/2014  . Anxiety state 02/17/2014  . Depression 02/17/2014   Past Medical History  Diagnosis Date  . Multiple allergies   . Anxiety   . Depression   . Migraine headache    Past Surgical History  Procedure Laterality Date  . Wisdom tooth extraction  03/1999   Allergies  Allergen Reactions  . Imitrex [Sumatriptan]    Prior to Admission medications   Medication Sig Start Date End Date Taking? Authorizing Provider  butalbital-acetaminophen-caffeine (FIORICET) 50-325-40 MG per tablet Take 1-2 tablets by mouth every 6 (six) hours as needed for headache. 02/16/14 02/16/15 Yes Lanier Clam V, PA-C  clonazePAM (KLONOPIN) 0.5 MG tablet Take 1 tablet (0.5 mg total) by mouth daily as needed for anxiety. 11/23/13  Yes Thao P Le, DO  cyclobenzaprine (FLEXERIL) 10 MG tablet Take 1 tablet (10 mg total) by mouth 3 (three) times daily as needed for muscle spasms. 11/23/13  Yes  Thao P Le, DO  FLUoxetine (PROZAC) 20 MG tablet Take 1 1/2 tab PO daily ( total 30 mg) 11/23/13  Yes Thao P Le, DO  ibuprofen (ADVIL,MOTRIN) 800 MG tablet Take 1 tablet (800 mg total) by mouth every 8 (eight) hours as needed for moderate pain. 12/19/13  Yes Emi Belfast, FNP  medroxyPROGESTERone (DEPO-PROVERA) 150 MG/ML injection Inject 150 mg into the muscle every 3 (three) months.   Yes Historical Provider, MD  meloxicam (MOBIC) 15 MG tablet Take 1 tablet (15 mg total) by mouth daily. 03/18/14  Yes Peyton Najjar, MD  oxyCODONE-acetaminophen (ROXICET) 5-325 MG per tablet Take 1 tablet by mouth every 6 (six) hours as needed for severe pain. Take with stool softener 01/31/14  Yes Thao P Le, DO  promethazine (PHENERGAN) 25 MG tablet Take 1 tablet (25 mg total) by mouth every 8 (eight) hours as needed for nausea or vomiting. 10/10/13  Yes Nelva Nay, PA-C   History   Social History  . Marital Status: Single    Spouse Name: N/A    Number of Children: N/A  . Years of Education: N/A   Occupational History  . Not on file.   Social History Main Topics  . Smoking status: Current Every Day Smoker -- 0.50 packs/day for 3 years    Types: Cigarettes  . Smokeless tobacco: Never Used  . Alcohol Use: 0.0 oz/week  0 Not specified per week     Comment: 3 drinks per week  . Drug Use: No  . Sexual Activity: Not on file   Other Topics Concern  . Not on file   Social History Narrative   Review of Systems  Constitutional: Positive for fever and chills.  HENT: Positive for congestion and sore throat. Negative for ear pain.   Respiratory: Positive for cough.   Gastrointestinal: Negative for nausea, vomiting and diarrhea.  Musculoskeletal: Positive for myalgias.  Neurological: Positive for headaches.       Objective:  BP 118/70 mmHg  Pulse 121  Temp(Src) 99.3 F (37.4 C) (Oral)  Resp 18  Ht 5\' 3"  (1.6 m)  Wt 195 lb 9.6 oz (88.724 kg)  BMI 34.66 kg/m2  SpO2 96%  Physical Exam    Constitutional: She is oriented to person, place, and time. She appears well-developed and well-nourished. No distress.  Pt is cooperative and not ill appearing.   HENT:  Head: Normocephalic and atraumatic.  Throat and TMs are clear.  Eyes: EOM are normal.  Neck: Normal range of motion.  Cardiovascular: Normal rate.   Pulmonary/Chest: Effort normal.  Occasional rhonchi on both side.  Neurological: She is alert and oriented to person, place, and time. No cranial nerve deficit. She exhibits normal muscle tone. Coordination normal.  Skin: Skin is warm and dry.  Psychiatric: She has a normal mood and affect. Her behavior is normal.  Nursing note and vitals reviewed.  Results for orders placed or performed in visit on 03/26/14  POCT CBC  Result Value Ref Range   WBC 5.0 4.6 - 10.2 K/uL   Lymph, poc 1.5 0.6 - 3.4   POC LYMPH PERCENT 30.5 10 - 50 %L   MID (cbc) 0.3 0 - 0.9   POC MID % 7.0 0 - 12 %M   POC Granulocyte 3.1 2 - 6.9   Granulocyte percent 62.5 37 - 80 %G   RBC 4.82 4.04 - 5.48 M/uL   Hemoglobin 13.7 12.2 - 16.2 g/dL   HCT, POC 09.842.6 11.937.7 - 47.9 %   MCV 88.3 80 - 97 fL   MCH, POC 28.4 27 - 31.2 pg   MCHC 32.2 31.8 - 35.4 g/dL   RDW, POC 14.714.4 %   Platelet Count, POC 228 142 - 424 K/uL   MPV 8.1 0 - 99.8 fL  POCT Influenza A/B  Result Value Ref Range   Influenza A, POC Negative    Influenza B, POC Negative   POCT rapid strep A  Result Value Ref Range   Rapid Strep A Screen Negative Negative       Assessment & Plan:   Symptoms are consistent with influenza. Check a strep test and flu test. White blood count is 5000. Signs and symptoms consistent with influenza.Angela Price. We'll treat with Tamiflu for 5 days.I personally performed the services described in this documentation, which was scribed in my presence. The recorded information has been reviewed and is accurate.

## 2014-05-02 ENCOUNTER — Ambulatory Visit (INDEPENDENT_AMBULATORY_CARE_PROVIDER_SITE_OTHER): Payer: No Typology Code available for payment source | Admitting: Internal Medicine

## 2014-05-02 VITALS — BP 118/82 | HR 100 | Temp 97.7°F | Resp 19 | Ht 64.0 in | Wt 196.0 lb

## 2014-05-02 DIAGNOSIS — F4310 Post-traumatic stress disorder, unspecified: Secondary | ICD-10-CM

## 2014-05-02 DIAGNOSIS — Z720 Tobacco use: Secondary | ICD-10-CM

## 2014-05-02 DIAGNOSIS — G43809 Other migraine, not intractable, without status migrainosus: Secondary | ICD-10-CM

## 2014-05-02 DIAGNOSIS — F17200 Nicotine dependence, unspecified, uncomplicated: Secondary | ICD-10-CM

## 2014-05-02 DIAGNOSIS — M542 Cervicalgia: Secondary | ICD-10-CM

## 2014-05-02 DIAGNOSIS — F32A Depression, unspecified: Secondary | ICD-10-CM

## 2014-05-02 DIAGNOSIS — F411 Generalized anxiety disorder: Secondary | ICD-10-CM

## 2014-05-02 DIAGNOSIS — G43109 Migraine with aura, not intractable, without status migrainosus: Secondary | ICD-10-CM

## 2014-05-02 DIAGNOSIS — S139XXA Sprain of joints and ligaments of unspecified parts of neck, initial encounter: Secondary | ICD-10-CM

## 2014-05-02 DIAGNOSIS — F329 Major depressive disorder, single episode, unspecified: Secondary | ICD-10-CM

## 2014-05-02 MED ORDER — ONDANSETRON 4 MG PO TBDP
8.0000 mg | ORAL_TABLET | Freq: Once | ORAL | Status: AC
Start: 2014-05-02 — End: 2014-05-02
  Administered 2014-05-02: 8 mg via ORAL

## 2014-05-02 MED ORDER — CYCLOBENZAPRINE HCL 10 MG PO TABS
10.0000 mg | ORAL_TABLET | Freq: Three times a day (TID) | ORAL | Status: DC | PRN
Start: 2014-05-02 — End: 2014-10-02

## 2014-05-02 MED ORDER — KETOROLAC TROMETHAMINE 60 MG/2ML IM SOLN
60.0000 mg | Freq: Once | INTRAMUSCULAR | Status: AC
Start: 2014-05-02 — End: 2014-05-02
  Administered 2014-05-02: 60 mg via INTRAMUSCULAR

## 2014-05-02 MED ORDER — BUTALBITAL-APAP-CAFFEINE 50-325-40 MG PO TABS: 1.0000 | ORAL_TABLET | Freq: Four times a day (QID) | ORAL | Status: DC | PRN

## 2014-05-02 MED ORDER — CLONAZEPAM 0.5 MG PO TABS: 0.5000 mg | ORAL_TABLET | Freq: Every day | ORAL | Status: DC | PRN

## 2014-05-02 MED ORDER — FLUOXETINE HCL 20 MG PO TABS: 20.0000 mg | ORAL_TABLET | Freq: Every day | ORAL | Status: DC

## 2014-05-02 NOTE — Progress Notes (Signed)
Subjective:  This chart was scribed for Angela Siaobert Doolittle, MD by [name], Medical Scribe. This patient was seen in Room 10 and the patient's care was started at 3:55 PM.   Patient ID: Angela Price, female    DOB: 08/12/1980, 34 y.o.   MRN: [id number]  HPI HPI Comments: Angela MareJoie Cranmer is a 34 y.o. female with a history of Migraine HAs, PTSD and borderline personality disorder who presents to the [facility] complaining of a constant left sided migraine that started yesterday.  Her symptoms started Sunday with an aura.  She took Fioricet yesterday with only temporary relief to her symptoms.  She has taken Phenergan in the past for nausea but does not like the fatigue she experiences while taking the medication.  She has experienced complete relief to her migraines in the past when she received a shot of Toradol. She usually does very well at home with Fioricet but it didn't work yesterday. She lists nausea and photophobia as associated symptoms.  She is allergic to Imitrex.    She is also complaining of constant rib pain that started a couple of days ago after she was coughing and hiccupping all day.  She would like a refill of Flexeril to alleviate her rib pain. 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  She is also complaining of pinching neck pain and stiffness that started yesterday. She has no history of prior neck injury. She wonders if this was associated with her coughing spell.   She has had recurrent numbness in her left and occasionally right middle fingertip that has gradually worsened over the past couple of months. This seems worse in the cold weather. No known injury. No weakness at work.   She is complaining of constant depression and anxiety with associated fatigue.  She has trouble finishing projects because she wants to sleep.  Her naps during the day will last 3-4 hours at a time.  She wakes up  during the night 2-3 times a week due to discomfort.  She does not feel like she gets enough sleep when she wakes up in the morning.  She does not snore and does not believe she is having symptoms indicative of sleep apnea.  She feels as though she has a lot of social anxiety even though she is a social person.  She also experiences a lot of pressure from her family.  She is no longer taking Prozac which was restarted last summer and which had a beneficial effect, and would like to restart the medication.  She had obsessive thinking and behaviors but no longer has to do rituals.  She improved with lots of mind control techniques that alleviated her symptoms. She is not currently seeing a therapist-$$$$.  She has experienced relief to her anxiety while taking Klonopin.    Past Medical History  Diagnosis Date   Multiple allergies    Anxiety    Depression    Migraine headache    Past Surgical History  Procedure Laterality Date   Wisdom tooth extraction  03/1999   Family History  Problem Relation Age of Onset   Cancer///her mother also had similar problems with depression /anxiety  Mother    History   Social History   Marital Status: Single    Spouse Name: N/A   Number of Children: N/A   Years of Education: N/A   Occupational History    massage therapist  Social History Main Topics   Smoking status: Current Every Day Smoker -- 0.50 packs/day for 3 years    Types: Cigarettes   Smokeless tobacco: Never Used   Alcohol Use: 0.0 oz/week    0 Standard drinks or equivalent per week     Comment: 3 drinks per week   Drug Use: No   Sexual Activity: Not on file   Other Topics Concern   Not on file   Social History Narrative   Allergies  Allergen Reactions   Imitrex [Sumatriptan]     Review of Systems  Constitutional: Positive for fatigue.  Gastrointestinal: Positive for nausea.  Musculoskeletal: Positive for neck pain and neck stiffness.  Neurological: Positive  for numbness and headaches.  Psychiatric/Behavioral: Positive for sleep disturbance. The patient is nervous/anxious.     Objective:  Physical Exam  Constitutional: She is oriented to person, place, and time. She appears well-developed and well-nourished.  Wearing sunglasses due to photophobia  HENT:  Head: Normocephalic and atraumatic.  Eyes: Conjunctivae and EOM are normal. Pupils are equal, round, and reactive to light.  Neck: Normal range of motion. No thyromegaly present.  She has good flexion and extension and rotation without creating pain in the posterior thoracic areas or exacerbating any numbness in her finger  Cardiovascular: Normal rate and regular rhythm.   Pulmonary/Chest: Effort normal.  Musculoskeletal: Normal range of motion.  Lymphadenopathy:    She has no cervical adenopathy.  Neurological: She is alert and oriented to person, place, and time. She has normal reflexes. No cranial nerve deficit. Coordination normal.  Skin: Skin is warm and dry.  Psychiatric:  Her mood is stable and her affect is appropriately concerned Judgment sound Thought content normal  Nursing note and vitals reviewed.     BP 118/82 mmHg   Pulse 100   Temp(Src) 97.7 F (36.5 C) (Oral)   Resp 19   Ht  (1.626 m)   Wt 177 lb (80.287 kg)   BMI 30.37 kg/m2   SpO2 97%  Wt Readings from Last 3 Encounters:  05/02/14 177 lb (80.287 kg)--- this weight is actually incorrect according to the patient and I will correct it. She was  told that her weight was 196 today   03/26/14 195 lb 9.6 oz (88.724 kg)  03/18/14 198 lb (89.812 kg)    Assessment & Plan:  Other type of migraine - Plan: ketorolac (TORADOL) injection 60 mg, ondansetron (ZOFRAN-ODT) disintegrating tablet 8 mg  Refill Flexeril and Fioricet Neck pain---? Relationship with numbness in left third finger which is intermittent and mostly in the morning  Discussed exercises and massage approach Anxiety state  Klonopin  refilled Depression-PTSD  Restart Prozac . increased to 30 mg in 3 weeks/discussed PTSD for dummies (she has done DBT in the past)  Discussed potential new therapists Chest wall pain-rib/muscle strain  Flexeril and stretching  Restorative exercise for thoracic extension daily Nicotine abuse  This cannot be a point of focus currently until her psychological symptoms are more stable  Meds ordered this encounter  Medications   ketorolac (TORADOL) injection 60 mg    Sig:    ondansetron (ZOFRAN-ODT) disintegrating tablet 8 mg    Sig:    butalbital-acetaminophen-caffeine (FIORICET) 50-325-40 MG per tablet    Sig: Take 1-2 tablets by mouth every 6 (six) hours as needed for headache.    Dispense:  20 tablet    Refill:  5   clonazePAM (KLONOPIN) 0.5 MG tablet    Sig: Take 1 tablet (  0.5 mg total) by mouth daily as needed for anxiety.    Dispense:  30 tablet    Refill:  5   cyclobenzaprine (FLEXERIL) 10 MG tablet    Sig: Take 1 tablet (10 mg total) by mouth 3 (three) times daily as needed for muscle spasms.    Dispense:  30 tablet    Refill:  5   FLUoxetine (PROZAC) 20 MG tablet    Sig: Take 1 tablet (20 mg total) by mouth daily. After 3 weeks by increase dose to one and half tablets daily    Dispense:  90 tablet    Refill:  1   50 minute OV  I have completed the patient encounter in its entirety as documented by the scribe, with editing by me where necessary. Robert P. Merla Riches, M.D.

## 2014-06-01 ENCOUNTER — Ambulatory Visit (INDEPENDENT_AMBULATORY_CARE_PROVIDER_SITE_OTHER): Payer: No Typology Code available for payment source | Admitting: Family Medicine

## 2014-06-01 VITALS — BP 138/82 | HR 76 | Temp 98.9°F | Resp 16 | Ht 63.5 in | Wt 199.6 lb

## 2014-06-01 DIAGNOSIS — T148XXA Other injury of unspecified body region, initial encounter: Secondary | ICD-10-CM

## 2014-06-01 DIAGNOSIS — M778 Other enthesopathies, not elsewhere classified: Secondary | ICD-10-CM

## 2014-06-01 DIAGNOSIS — M79644 Pain in right finger(s): Secondary | ICD-10-CM

## 2014-06-01 DIAGNOSIS — M659 Synovitis and tenosynovitis, unspecified: Secondary | ICD-10-CM

## 2014-06-01 MED ORDER — IBUPROFEN 800 MG PO TABS: 800.0000 mg | ORAL_TABLET | Freq: Three times a day (TID) | ORAL | Status: DC | PRN

## 2014-06-01 NOTE — Progress Notes (Signed)
Chief Complaint:  Chief Complaint  Patient presents with  . Hand Pain    R hand x 1 wk and has gradually gotten worse and today was the worst  . Arm Pain    x r arm, up to elbow    HPI: Angela Price is a 34 y.o. female who is here for  right hand pain at the base of her thumb that has been radiating up to her right elbow for the last 1 week. She is a Technical brewermasseuse. She has some numbness and tingling. It is not in the carpal tunnel area. She has tried over-the-counter ibuprofen and rest, ice, and some compression. It hurts to flex her hand and also she has pain when she has to do heart grip. 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 She has had this before but never this long. At rest the pain is 6 out of 10 but when she is using her hand it is severe. She is right-hand dominant.  Past Medical History  Diagnosis Date  . Multiple allergies   . Anxiety   . Depression   . Migraine headache    Past Surgical History  Procedure Laterality Date  . Wisdom tooth extraction  03/1999   History   Social History  . Marital Status: Single    Spouse Name: N/A  . Number of Children: N/A  . Years of Education: N/A   Social History Main Topics  . Smoking status: Current Every Day Smoker -- 0.50 packs/day for 3 years    Types: Cigarettes  . Smokeless tobacco: Never Used  . Alcohol Use: 0.0 oz/week    0 Standard drinks or equivalent per week     Comment: 3 drinks per week  . Drug Use: No  . Sexual Activity: Not on file   Other Topics Concern  . None   Social History Narrative   Family History  Problem Relation Age of Onset  . Cancer Mother    Allergies  Allergen Reactions  . Imitrex [Sumatriptan]    Prior to Admission medications   Medication Sig Start Date End Date Taking? Authorizing Provider  butalbital-acetaminophen-caffeine (FIORICET) 50-325-40 MG per tablet Take 1-2 tablets by mouth every 6 (six) hours  as needed for headache. 05/02/14 05/02/15 Yes Tonye Pearsonobert P Doolittle, MD  clonazePAM (KLONOPIN) 0.5 MG tablet Take 1 tablet (0.5 mg total) by mouth daily as needed for anxiety. 05/02/14  Yes Tonye Pearsonobert P Doolittle, MD  cyclobenzaprine (FLEXERIL) 10 MG tablet Take 1 tablet (10 mg total) by mouth 3 (three) times daily as needed for muscle spasms. 05/02/14  Yes Tonye Pearsonobert P Doolittle, MD  FLUoxetine (PROZAC) 20 MG tablet Take 1 tablet (20 mg total) by mouth daily. After 3 weeks by increase dose to one and half tablets daily 05/02/14  Yes Tonye Pearsonobert P Doolittle, MD  ibuprofen (ADVIL,MOTRIN) 800 MG tablet Take 1 tablet (800 mg total) by mouth every 8 (eight) hours as needed for moderate pain. 12/19/13  Yes Emi Belfasteborah B Gessner, FNP  medroxyPROGESTERone (DEPO-PROVERA) 150 MG/ML injection Inject 150 mg into the muscle every 3 (three) months.   Yes Historical Provider, MD     ROS: The patient denies fevers, chills, night sweats, unintentional weight loss, chest pain, palpitations, wheezing, dyspnea on exertion, nausea, vomiting, abdominal pain, dysuria, hematuria, melena  All other systems have been reviewed and were otherwise negative with the exception of those mentioned in the  HPI and as above.    PHYSICAL EXAM: Filed Vitals:   06/01/14 1905  BP: 138/82  Pulse: 76  Temp: 98.9 F (37.2 C)  Resp: 16   Filed Vitals:   06/01/14 1905  Height: 5' 3.5" (1.613 m)  Weight: 199 lb 9.6 oz (90.538 kg)   Body mass index is 34.8 kg/(m^2).  General: Alert, no acute distress HEENT:  Normocephalic, atraumatic, oropharynx patent. EOMI, PERRLA Cardiovascular:  Regular rate and rhythm, no rubs murmurs or gallops.  No Carotid bruits, radial pulse intact. No pedal edema.  Respiratory: Clear to auscultation bilaterally.  No wheezes, rales, or rhonchi.  No cyanosis, no use of accessory musculature GI: No organomegaly, abdomen is soft and non-tender, positive bowel sounds.  No masses. Skin: No rashes. Neurologic: Facial musculature  symmetric. Psychiatric: Patient is appropriate throughout our interaction. Lymphatic: No cervical lymphadenopathy Musculoskeletal: Gait intact. RIGHT hand- No appreciable deformities, no muscle hypertrophy or wasting + tenderness at the base of the right thumb. Full ROM 5/5 strength, 2/2 DTRs Sensation intact, radial pulse and brisk Good cap refill She has tenderness at the lateral epicondyle There is full range of motion however there is pain with forceful supination and pronation.. 5 out of 5 strength Negative for Phalen's,  negative Finkelstein    LABS: Results for orders placed or performed in visit on 03/26/14  POCT CBC  Result Value Ref Range   WBC 5.0 4.6 - 10.2 K/uL   Lymph, poc 1.5 0.6 - 3.4   POC LYMPH PERCENT 30.5 10 - 50 %L   MID (cbc) 0.3 0 - 0.9   POC MID % 7.0 0 - 12 %M   POC Granulocyte 3.1 2 - 6.9   Granulocyte percent 62.5 37 - 80 %G   RBC 4.82 4.04 - 5.48 M/uL   Hemoglobin 13.7 12.2 - 16.2 g/dL   HCT, POC 16.1 09.6 - 47.9 %   MCV 88.3 80 - 97 fL   MCH, POC 28.4 27 - 31.2 pg   MCHC 32.2 31.8 - 35.4 g/dL   RDW, POC 04.5 %   Platelet Count, POC 228 142 - 424 K/uL   MPV 8.1 0 - 99.8 fL  POCT Influenza A/B  Result Value Ref Range   Influenza A, POC Negative    Influenza B, POC Negative   POCT rapid strep A  Result Value Ref Range   Rapid Strep A Screen Negative Negative     EKG/XRAY:   Primary read interpreted by Dr. Conley Rolls at [facility].   ASSESSMENT/PLAN: Encounter Diagnoses  Name Primary?  . Tendonitis of elbow, right Yes  . Thumb pain, right   . Sprain and strain    She will use the Flexeril she has at home Prescribed ibuprofen 800 mg by mouth 3 times a day when necessary Work note given Thumb spica given Lateral epicondylitis exercises given, advised that she could purchase a forearm band to for her epicondylitis. Follow up as needed  Gross sideeffects, risk and benefits, and alternatives of medications d/w patient. Patient is aware that all  medications have potential sideeffects and we are unable to predict every sideeffect or drug-drug interaction that may occur.  Hamilton Capri PHUONG, DO 06/01/2014 9:34 PM

## 2014-06-01 NOTE — Patient Instructions (Signed)

## 2014-06-22 ENCOUNTER — Ambulatory Visit (INDEPENDENT_AMBULATORY_CARE_PROVIDER_SITE_OTHER): Payer: No Typology Code available for payment source | Admitting: Family Medicine

## 2014-06-22 VITALS — BP 134/94 | HR 94 | Temp 98.4°F | Resp 16 | Ht 63.75 in | Wt 193.0 lb

## 2014-06-22 DIAGNOSIS — G43109 Migraine with aura, not intractable, without status migrainosus: Secondary | ICD-10-CM | POA: Diagnosis not present

## 2014-06-22 DIAGNOSIS — F4310 Post-traumatic stress disorder, unspecified: Secondary | ICD-10-CM | POA: Diagnosis not present

## 2014-06-22 DIAGNOSIS — H5319 Other subjective visual disturbances: Secondary | ICD-10-CM | POA: Diagnosis not present

## 2014-06-22 DIAGNOSIS — F411 Generalized anxiety disorder: Secondary | ICD-10-CM

## 2014-06-22 DIAGNOSIS — R110 Nausea: Secondary | ICD-10-CM | POA: Diagnosis not present

## 2014-06-22 DIAGNOSIS — H53149 Visual discomfort, unspecified: Secondary | ICD-10-CM

## 2014-06-22 MED ORDER — ALPRAZOLAM 0.25 MG PO TABS: 0.2500 mg | ORAL_TABLET | Freq: Three times a day (TID) | ORAL | Status: DC | PRN

## 2014-06-22 MED ORDER — ONDANSETRON 4 MG PO TBDP: 4.0000 mg | ORAL_TABLET | Freq: Three times a day (TID) | ORAL | Status: DC | PRN
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
## 2014-07-19 ENCOUNTER — Other Ambulatory Visit: Payer: Self-pay | Admitting: Family Medicine

## 2014-07-20 ENCOUNTER — Telehealth: Payer: Self-pay | Admitting: Radiology

## 2014-07-20 NOTE — Telephone Encounter (Signed)
Called in rx and notified pt via voicemail. 

## 2014-09-07 ENCOUNTER — Ambulatory Visit (INDEPENDENT_AMBULATORY_CARE_PROVIDER_SITE_OTHER): Payer: 59 | Admitting: Family Medicine

## 2014-09-07 VITALS — BP 130/80 | HR 111 | Temp 99.0°F | Resp 16 | Ht 65.0 in | Wt 194.0 lb

## 2014-09-07 DIAGNOSIS — F439 Reaction to severe stress, unspecified: Secondary | ICD-10-CM

## 2014-09-07 DIAGNOSIS — F4310 Post-traumatic stress disorder, unspecified: Secondary | ICD-10-CM

## 2014-09-07 DIAGNOSIS — Z638 Other specified problems related to primary support group: Secondary | ICD-10-CM | POA: Diagnosis not present

## 2014-09-07 DIAGNOSIS — Z720 Tobacco use: Secondary | ICD-10-CM

## 2014-09-07 DIAGNOSIS — G43109 Migraine with aura, not intractable, without status migrainosus: Secondary | ICD-10-CM

## 2014-09-07 DIAGNOSIS — M940 Chondrocostal junction syndrome [Tietze]: Secondary | ICD-10-CM

## 2014-09-07 DIAGNOSIS — F329 Major depressive disorder, single episode, unspecified: Secondary | ICD-10-CM | POA: Diagnosis not present

## 2014-09-07 DIAGNOSIS — F411 Generalized anxiety disorder: Secondary | ICD-10-CM

## 2014-09-07 DIAGNOSIS — F32A Depression, unspecified: Secondary | ICD-10-CM

## 2014-09-07 DIAGNOSIS — N921 Excessive and frequent menstruation with irregular cycle: Secondary | ICD-10-CM | POA: Diagnosis not present

## 2014-09-07 DIAGNOSIS — F17200 Nicotine dependence, unspecified, uncomplicated: Secondary | ICD-10-CM

## 2014-09-07 MED ORDER — PROMETHAZINE HCL 25 MG/ML IJ SOLN
25.0000 mg | Freq: Once | INTRAMUSCULAR | Status: AC
  Administered 2014-09-07: 25 mg via INTRAMUSCULAR

## 2014-09-07 MED ORDER — FLUOXETINE HCL 40 MG PO CAPS
40.0000 mg | ORAL_CAPSULE | Freq: Every day | ORAL | Status: DC
Start: 2014-09-07 — End: 2014-10-02

## 2014-09-07 MED ORDER — KETOROLAC TROMETHAMINE 60 MG/2ML IM SOLN
60.0000 mg | Freq: Once | INTRAMUSCULAR | Status: AC
  Administered 2014-09-07: 60 mg via INTRAMUSCULAR

## 2014-09-07 MED ORDER — BUPROPION HCL ER (XL) 150 MG PO TB24: 150.0000 mg | ORAL_TABLET | Freq: Every day | ORAL | Status: DC

## 2014-09-07 MED ORDER — ONDANSETRON 4 MG PO TBDP: 4.0000 mg | ORAL_TABLET | Freq: Three times a day (TID) | ORAL | Status: DC | PRN

## 2014-09-07 MED ORDER — ALPRAZOLAM 0.25 MG PO TABS: 0.2500 mg | ORAL_TABLET | Freq: Every day | ORAL | Status: DC

## 2014-09-07 MED ORDER — AMITRIPTYLINE HCL 25 MG PO TABS
25.0000 mg | ORAL_TABLET | Freq: Every day | ORAL | Status: DC
Start: 2014-09-07 — End: 2014-10-02

## 2014-09-07 MED ORDER — PROAIR RESPICLICK 108 (90 BASE) MCG/ACT IN AEPB: 2.0000 | INHALATION_SPRAY | RESPIRATORY_TRACT | Status: AC | PRN

## 2014-09-07 NOTE — Patient Instructions (Signed)
Bronchospasm A bronchospasm is when the tubes that carry air in and out of your lungs (airways) spasm or tighten. During a bronchospasm it is hard to breathe. This is because the airways get smaller. A bronchospasm can be triggered by:  Allergies. These may be to animals, pollen, food, or mold.  Infection. This is a common cause of bronchospasm.  Exercise.  Irritants. These include pollution, cigarette smoke, strong odors, aerosol sprays, and paint fumes.  Weather changes.  Stress.  Being emotional. HOME CARE   Always have a plan for getting help. Know when to call your doctor and local emergency services (911 in the [place] Know where you can get emergency care.  Only take medicines as told by your doctor.  If you were prescribed an inhaler or nebulizer machine, ask your doctor how to use it correctly. Always use a spacer with your inhaler if you were given one.  Stay calm during an attack. Try to relax and breathe more slowly.  Control your home environment:  Change your heating and air conditioning filter at least once a month.  Limit your use of fireplaces and wood stoves.  Do not  smoke. Do not  allow smoking in your home.  Avoid perfumes and fragrances.  Get rid of pests (such as roaches and mice) and their droppings.  Throw away plants if you see mold on them.  Keep your house clean and dust free.  Replace carpet with wood, tile, or vinyl flooring. Carpet can trap dander and dust.  Use allergy-proof pillows, mattress covers, and box spring covers.  Wash bed sheets and blankets every week in hot water. Dry them in a dryer.  Use blankets that are made of polyester or cotton.  Wash hands frequently. GET HELP IF:  You have muscle aches.  You have chest pain.  The thick spit you spit or cough up (sputum) changes from clear or white to yellow, green, gray, or bloody.  The thick spit you spit or cough up gets thicker.  There are problems that may be related  to the medicine you are given such as:  A rash.  Itching.  Swelling.  Trouble breathing. GET HELP RIGHT AWAY IF:  You feel you cannot breathe or catch your breath.  You cannot stop coughing.  Your treatment is not helping you breathe better.  You have very bad chest pain. MAKE SURE YOU:   Understand these instructions.  Will watch your condition.  Will get help right away if you are not doing well or get worse. Document Released: 12/29/2008 Document Revised: 03/08/2013 Document Reviewed: 08/24/2012 [facility] Patient Information 2015 [place], [place]. This information is not intended to replace advice given to you by your health care provider. Make sure you discuss any questions you have with your health care provider.  Polycystic Ovarian Syndrome Polycystic ovarian syndrome (PCOS) is a common hormonal disorder among women of reproductive age. Most women with PCOS grow many small cysts on their ovaries. PCOS can cause problems with your periods and make it difficult to get pregnant. It can also cause an increased risk of miscarriage with pregnancy. If left untreated, PCOS can lead to serious health problems, such as diabetes and heart disease. CAUSES The cause of PCOS is not fully understood, but genetics may be a factor. SIGNS AND SYMPTOMS   Infrequent or no menstrual periods.   Inability to get pregnant (infertility) because of not ovulating.   Increased growth of hair on the face, chest, stomach, back, thumbs, thighs,  or toes.   Acne, oily skin, or dandruff.   Pelvic pain.   Weight gain or obesity, usually carrying extra weight around the waist.   Type 2 diabetes.   High cholesterol.   High blood pressure.   Female-pattern baldness or thinning hair.   Patches of thickened and dark brown or black skin on the neck, arms, breasts, or thighs.   Tiny excess flaps of skin (skin tags) in the armpits or neck area.   Excessive snoring and having breathing  stop at times while asleep (sleep apnea).   Deepening of the voice.   Gestational diabetes when pregnant.  DIAGNOSIS  There is no single test to diagnose PCOS.   Your health care provider will:   Take a medical history.   Perform a pelvic exam.   Have ultrasonography done.   Check your female and female hormone levels.   Measure glucose or sugar levels in the blood.   Do other blood tests.   If you are producing too many female hormones, your health care provider will make sure it is from PCOS. At the physical exam, your health care provider will want to evaluate the areas of increased hair growth. Try to allow natural hair growth for a few days before the visit.   During a pelvic exam, the ovaries may be enlarged or swollen because of the increased number of small cysts. This can be seen more easily by using vaginal ultrasonography or screening to examine the ovaries and lining of the uterus (endometrium) for cysts. The uterine lining may become thicker if you have not been having a regular period.  TREATMENT  Because there is no cure for PCOS, it needs to be managed to prevent problems. Treatments are based on your symptoms. Treatment is also based on whether you want to have a baby or whether you need contraception.  Treatment may include:   Progesterone hormone to start a menstrual period.   Birth control pills to make you have regular menstrual periods.   Medicines to make you ovulate, if you want to get pregnant.   Medicines to control your insulin.   Medicine to control your blood pressure.   Medicine and diet to control your high cholesterol and triglycerides in your blood.  Medicine to reduce excessive hair growth.  Surgery, making small holes in the ovary, to decrease the amount of female hormone production. This is done through a long, lighted tube (laparoscope) placed into the pelvis through a tiny incision in the lower abdomen.  HOME CARE  INSTRUCTIONS  Only take over-the-counter or prescription medicine as directed by your health care provider.  Pay attention to the foods you eat and your activity levels. This can help reduce the effects of PCOS.  Keep your weight under control.  Eat foods that are low in carbohydrate and high in fiber.  Exercise regularly. SEEK MEDICAL CARE IF:  Your symptoms do not get better with medicine.  You have new symptoms. Document Released: 06/27/2004 Document Revised: 12/22/2012 Document Reviewed: 08/19/2012 [facility] Patient Information 2015 [place], [place]. This information is not intended to replace advice given to you by your health care provider. Make sure you discuss any questions you have with your health care provider.  Headaches, Frequently Asked Questions MIGRAINE HEADACHES Q: What is migraine? What causes it? How can I treat it? A: Generally, migraine headaches begin as a dull ache. Then they develop into a constant, throbbing, and pulsating pain. You may experience pain at the temples. You  may experience pain at the front or back of one or both sides of the head. The pain is usually accompanied by a combination of:  Nausea.  Vomiting.  Sensitivity to light and noise. Some people (about 15%) experience an aura (see below) before an attack. The cause of migraine is believed to be chemical reactions in the brain. Treatment for migraine may include over-the-counter or prescription medications. It may also include self-help techniques. These include relaxation training and biofeedback.  Q: What is an aura? A: About 15% of people with migraine get an "aura". This is a sign of neurological symptoms that occur before a migraine headache. You may see wavy or jagged lines, dots, or flashing lights. You might experience tunnel vision or blind spots in one or both eyes. The aura can include visual or auditory hallucinations (something imagined). It may include disruptions in smell (such as  strange odors), taste or touch. Other symptoms include:  Numbness.  A "pins and needles" sensation.  Difficulty in recalling or speaking the correct word. These neurological events may last as long as 60 minutes. These symptoms will fade as the headache begins. Q: What is a trigger? A: Certain physical or environmental factors can lead to or "trigger" a migraine. These include:  Foods.  Hormonal changes.  Weather.  Stress. It is important to remember that triggers are different for everyone. To help prevent migraine attacks, you need to figure out which triggers affect you. Keep a headache diary. This is a good way to track triggers. The diary will help you talk to your healthcare professional about your condition. Q: Does weather affect migraines? A: Bright sunshine, hot, humid conditions, and drastic changes in barometric pressure may lead to, or "trigger," a migraine attack in some people. But studies have shown that weather does not act as a trigger for everyone with migraines. Q: What is the link between migraine and hormones? A: Hormones start and regulate many of your body's functions. Hormones keep your body in balance within a constantly changing environment. The levels of hormones in your body are unbalanced at times. Examples are during menstruation, pregnancy, or menopause. That can lead to a migraine attack. In fact, about three quarters of all women with migraine report that their attacks are related to the menstrual cycle.  Q: Is there an increased risk of stroke for migraine sufferers? A: The likelihood of a migraine attack causing a stroke is very remote. That is not to say that migraine sufferers cannot have a stroke associated with their migraines. In persons under age 77, the most common associated factor for stroke is migraine headache. But over the course of a person's normal life span, the occurrence of migraine headache may actually be associated with a reduced risk of  dying from cerebrovascular disease due to stroke.  Q: What are acute medications for migraine? A: Acute medications are used to treat the pain of the headache after it has started. Examples over-the-counter medications, NSAIDs, ergots, and triptans.  Q: What are the triptans? A: Triptans are the newest class of abortive medications. They are specifically targeted to treat migraine. Triptans are vasoconstrictors. They moderate some chemical reactions in the brain. The triptans work on receptors in your brain. Triptans help to restore the balance of a neurotransmitter called serotonin. Fluctuations in levels of serotonin are thought to be a main cause of migraine.  Q: Are over-the-counter medications for migraine effective? A: Over-the-counter, or "OTC," medications may be effective in relieving mild to moderate  pain and associated symptoms of migraine. But you should see your caregiver before beginning any treatment regimen for migraine.  Q: What are preventive medications for migraine? A: Preventive medications for migraine are sometimes referred to as "prophylactic" treatments. They are used to reduce the frequency, severity, and length of migraine attacks. Examples of preventive medications include antiepileptic medications, antidepressants, beta-blockers, calcium channel blockers, and NSAIDs (nonsteroidal anti-inflammatory drugs). Q: Why are anticonvulsants used to treat migraine? A: During the past few years, there has been an increased interest in antiepileptic drugs for the prevention of migraine. They are sometimes referred to as "anticonvulsants". Both epilepsy and migraine may be caused by similar reactions in the brain.  Q: Why are antidepressants used to treat migraine? A: Antidepressants are typically used to treat people with depression. They may reduce migraine frequency by regulating chemical levels, such as serotonin, in the brain.  Q: What alternative therapies are used to treat  migraine? A: The term "alternative therapies" is often used to describe treatments considered outside the scope of conventional Western medicine. Examples of alternative therapy include acupuncture, acupressure, and yoga. Another common alternative treatment is herbal therapy. Some herbs are believed to relieve headache pain. Always discuss alternative therapies with your caregiver before proceeding. Some herbal products contain arsenic and other toxins. TENSION HEADACHES Q: What is a tension-type headache? What causes it? How can I treat it? A: Tension-type headaches occur randomly. They are often the result of temporary stress, anxiety, fatigue, or anger. Symptoms include soreness in your temples, a tightening band-like sensation around your head (a "vice-like" ache). Symptoms can also include a pulling feeling, pressure sensations, and contracting head and neck muscles. The headache begins in your forehead, temples, or the back of your head and neck. Treatment for tension-type headache may include over-the-counter or prescription medications. Treatment may also include self-help techniques such as relaxation training and biofeedback. CLUSTER HEADACHES Q: What is a cluster headache? What causes it? How can I treat it? A: Cluster headache gets its name because the attacks come in groups. The pain arrives with little, if any, warning. It is usually on one side of the head. A tearing or bloodshot eye and a runny nose on the same side of the headache may also accompany the pain. Cluster headaches are believed to be caused by chemical reactions in the brain. They have been described as the most severe and intense of any headache type. Treatment for cluster headache includes prescription medication and oxygen. SINUS HEADACHES Q: What is a sinus headache? What causes it? How can I treat it? A: When a cavity in the bones of the face and skull (a sinus) becomes inflamed, the inflammation will cause localized  pain. This condition is usually the result of an allergic reaction, a tumor, or an infection. If your headache is caused by a sinus blockage, such as an infection, you will probably have a fever. An x-ray will confirm a sinus blockage. Your caregiver's treatment might include antibiotics for the infection, as well as antihistamines or decongestants.  REBOUND HEADACHES Q: What is a rebound headache? What causes it? How can I treat it? A: A pattern of taking acute headache medications too often can lead to a condition known as "rebound headache." A pattern of taking too much headache medication includes taking it more than 2 days per week or in excessive amounts. That means more than the label or a caregiver advises. With rebound headaches, your medications not only stop relieving pain, they actually begin  to cause headaches. Doctors treat rebound headache by tapering the medication that is being overused. Sometimes your caregiver will gradually substitute a different type of treatment or medication. Stopping may be a challenge. Regularly overusing a medication increases the potential for serious side effects. Consult a caregiver if you regularly use headache medications more than 2 days per week or more than the label advises. ADDITIONAL QUESTIONS AND ANSWERS Q: What is biofeedback? A: Biofeedback is a self-help treatment. Biofeedback uses special equipment to monitor your body's involuntary physical responses. Biofeedback monitors:  Breathing.  Pulse.  Heart rate.  Temperature.  Muscle tension.  Brain activity. Biofeedback helps you refine and perfect your relaxation exercises. You learn to control the physical responses that are related to stress. Once the technique has been mastered, you do not need the equipment any more. Q: Are headaches hereditary? A: Four out of five (80%) of people that suffer report a family history of migraine. Scientists are not sure if this is genetic or a family  predisposition. Despite the uncertainty, a child has a 50% chance of having migraine if one parent suffers. The child has a 75% chance if both parents suffer.  Q: Can children get headaches? A: By the time they reach high school, most young people have experienced some type of headache. Many safe and effective approaches or medications can prevent a headache from occurring or stop it after it has begun.  Q: What type of doctor should I see to diagnose and treat my headache? A: Start with your primary caregiver. Discuss his or her experience and approach to headaches. Discuss methods of classification, diagnosis, and treatment. Your caregiver may decide to recommend you to a headache specialist, depending upon your symptoms or other physical conditions. Having diabetes, allergies, etc., may require a more comprehensive and inclusive approach to your headache. The National Head[employer] Foundation will provide, upon request, a list of [facility] physician members in your state. Document Released: 05/24/2003 Document Revised: 05/26/2011 Document Reviewed: 11/01/2007 [facility] Patient Information 2015 [place], [place]. This information is not intended to replace advice given to you by your health care provider. Make sure you discuss any questions you have with your health care provider.
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
## 2014-10-02 ENCOUNTER — Ambulatory Visit (INDEPENDENT_AMBULATORY_CARE_PROVIDER_SITE_OTHER): Payer: 59 | Admitting: Family Medicine

## 2014-10-02 VITALS — BP 128/76 | HR 111 | Temp 99.1°F | Resp 18 | Ht 63.5 in | Wt 200.0 lb

## 2014-10-02 DIAGNOSIS — S56912A Strain of unspecified muscles, fascia and tendons at forearm level, left arm, initial encounter: Secondary | ICD-10-CM | POA: Diagnosis not present

## 2014-10-02 DIAGNOSIS — F411 Generalized anxiety disorder: Secondary | ICD-10-CM | POA: Diagnosis not present

## 2014-10-02 DIAGNOSIS — G43109 Migraine with aura, not intractable, without status migrainosus: Secondary | ICD-10-CM

## 2014-10-02 DIAGNOSIS — S139XXA Sprain of joints and ligaments of unspecified parts of neck, initial encounter: Secondary | ICD-10-CM

## 2014-10-02 DIAGNOSIS — M940 Chondrocostal junction syndrome [Tietze]: Secondary | ICD-10-CM | POA: Diagnosis not present

## 2014-10-02 MED ORDER — CYCLOBENZAPRINE HCL 10 MG PO TABS: 10.0000 mg | ORAL_TABLET | Freq: Three times a day (TID) | ORAL | Status: DC | PRN

## 2014-10-02 MED ORDER — IBUPROFEN 800 MG PO TABS: 800.0000 mg | ORAL_TABLET | Freq: Three times a day (TID) | ORAL | Status: DC | PRN

## 2014-10-02 MED ORDER — FLUOXETINE HCL 40 MG PO CAPS: 40.0000 mg | ORAL_CAPSULE | Freq: Every day | ORAL | Status: DC

## 2014-10-02 MED ORDER — BUPROPION HCL ER (XL) 150 MG PO TB24: 150.0000 mg | ORAL_TABLET | Freq: Every day | ORAL | Status: DC

## 2014-10-02 MED ORDER — ALPRAZOLAM 0.25 MG PO TABS: 0.2500 mg | ORAL_TABLET | Freq: Every day | ORAL | Status: DC

## 2014-10-02 MED ORDER — PROMETHAZINE HCL 25 MG/ML IJ SOLN
25.0000 mg | Freq: Once | INTRAMUSCULAR | Status: AC
  Administered 2014-10-02: 25 mg via INTRAMUSCULAR

## 2014-10-02 MED ORDER — ONDANSETRON 4 MG PO TBDP: 4.0000 mg | ORAL_TABLET | Freq: Three times a day (TID) | ORAL | Status: DC | PRN

## 2014-10-02 MED ORDER — BUTALBITAL-APAP-CAFFEINE 50-325-40 MG PO TABS: 1.0000 | ORAL_TABLET | Freq: Four times a day (QID) | ORAL | Status: DC | PRN

## 2014-10-02 MED ORDER — AMITRIPTYLINE HCL 25 MG PO TABS: 25.0000 mg | ORAL_TABLET | Freq: Every day | ORAL | Status: DC

## 2014-10-02 MED ORDER — KETOROLAC TROMETHAMINE 60 MG/2ML IM SOLN
60.0000 mg | Freq: Once | INTRAMUSCULAR | Status: AC
  Administered 2014-10-02: 60 mg via INTRAMUSCULAR

## 2014-10-02 NOTE — Patient Instructions (Signed)

## 2014-10-02 NOTE — Progress Notes (Signed)
Subjective:    Patient ID: Angela Price, female    DOB: 26-May-1980, 34 y.o.   MRN: [id number] This chart was scribed for [name], MD by Jolene Provost, Medical Scribe. This patient was seen in Room 14 and the patient's care was started a 6:04 PM.  Chief Complaint  Patient presents with  . Migraine    started 2:30 am  . Fatigue  . Nausea    HPI HPI Comments: Angela Price is a 34 y.o. female who presents to [facility] complaining of a migraine HA that started at 2:30 am. Pt states she took Fioricet this morning and it had no impact on her sx. Pt stated she started taking elavil and has had good results. Pt states she is taking prozac once daily. Pt states there is nothing a-typical about her HA today. Pt states the treatments she received for migraine last time she reported to [facility] worked well.   Pt has an extensive family hx of migraines and has had a long personal hx of a-typical migraines. Pt's sister had a stoke at young age and is a paraplegic. Pt has reported visual hallucinations as well as jaw and hand heaviness in the past. Pt has had no prior neurological evaluation. At last visit pt was referred to neurology, but pt has not called to schedule an evaluation. Pt was started on Elavil at last visit [facility], and continued on every other day prozac. Pt was alsostarted on Wellbutrin 150 daily at last visit. Pt was advised to take her xanex daily at that visit.    Past Medical History  Diagnosis Date  . Multiple allergies   . Anxiety   . Depression   . Migraine headache    Allergies  Allergen Reactions  . Imitrex [Sumatriptan]    Current Outpatient Prescriptions on File Prior to Visit  Medication Sig Dispense Refill  . ALPRAZolam (XANAX) 0.25 MG tablet Take 1 tablet (0.25 mg total) by mouth daily. At 5 pm 30 tablet 0  . amitriptyline (ELAVIL) 25 MG tablet Take 1 tablet (25 mg total) by mouth at bedtime. 30 tablet 0  . buPROPion (WELLBUTRIN XL) 150 MG 24 hr tablet Take 1 tablet (150 mg  total) by mouth daily. 30 tablet 0  . butalbital-acetaminophen-caffeine (FIORICET) 50-325-40 MG per tablet Take 1-2 tablets by mouth every 6 (six) hours as needed for headache. 20 tablet 5  . cyclobenzaprine (FLEXERIL) 10 MG tablet Take 1 tablet (10 mg total) by mouth 3 (three) times daily as needed for muscle spasms. 30 tablet 5  . FLUoxetine (PROZAC) 40 MG capsule Take 1 capsule (40 mg total) by mouth daily. 30 capsule 0  . ibuprofen (ADVIL,MOTRIN) 800 MG tablet Take 1 tablet (800 mg total) by mouth every 8 (eight) hours as needed for moderate pain. 30 tablet 1  . medroxyPROGESTERone (DEPO-PROVERA) 150 MG/ML injection Inject 150 mg into the muscle every 3 (three) months.    . ondansetron (ZOFRAN ODT) 4 MG disintegrating tablet Take 1 tablet (4 mg total) by mouth every 8 (eight) hours as needed for nausea or vomiting. 20 tablet 0  . PROAIR RESPICLICK 108 (90 BASE) MCG/ACT AEPB Inhale 2 puffs into the lungs every 4 (four) hours as needed (wheezing). 1 each 5   No current facility-administered medications on file prior to visit.   Depression screen [facility] 2/9 10/02/2014 09/07/2014  Decreased Interest 0 -  Down, Depressed, Hopeless 0 3  PHQ - 2 Score 0 3  Altered sleeping - 3  Tired,  decreased energy - 3  Change in appetite - 3  Feeling bad or failure about yourself  - 3  Trouble concentrating - 3  Moving slowly or fidgety/restless - 3  Suicidal thoughts - 3  PHQ-9 Score - 24  Difficult doing work/chores - Extremely dIfficult      Review of Systems  Constitutional: Positive for fatigue. Negative for fever, chills, diaphoresis, appetite change and unexpected weight change.  HENT: Negative for congestion, dental problem, ear pain, rhinorrhea, sinus pressure and tinnitus.   Eyes: Positive for photophobia and pain. Negative for discharge and visual disturbance.  Cardiovascular: Negative for palpitations.  Gastrointestinal: Positive for nausea. Negative for vomiting.  Musculoskeletal: Negative  for neck pain and neck stiffness.  Skin: Negative for rash.  Neurological: Positive for headaches. Negative for dizziness, tremors, seizures, syncope, facial asymmetry, speech difficulty, weakness, light-headedness and numbness.  Psychiatric/Behavioral: Positive for sleep disturbance and dysphoric mood. The patient is nervous/anxious.        Objective:  BP 128/76 mmHg  Pulse 111  Temp(Src) 99.1 F (37.3 C) (Oral)  Resp 18  Ht 5' 3.5" (1.613 m)  Wt 200 lb (90.719 kg)  BMI 34.87 kg/m2  SpO2 98%  Physical Exam  Constitutional: She is oriented to person, place, and time. She appears well-developed and well-nourished. No distress.  HENT:  Head: Normocephalic and atraumatic.  Eyes: Pupils are equal, round, and reactive to light.  Neck: Neck supple.  Cardiovascular: Normal rate.   Pulmonary/Chest: Effort normal. No respiratory distress.  Musculoskeletal: Normal range of motion.  Neurological: She is alert and oriented to person, place, and time. She has normal strength. No cranial nerve deficit or sensory deficit. She exhibits normal muscle tone. She displays a negative Romberg sign. Coordination and gait normal.  Skin: Skin is warm and dry. She is not diaphoretic.  Psychiatric: She has a normal mood and affect. Her behavior is normal.  Nursing note and vitals reviewed.       Assessment & Plan:   1. Migraine with aura and without status migrainosus, not intractable   2. Neck sprain and strain, initial encounter   3. Forearm strain, left, initial encounter   4. Costochondritis   5.      Anxiety - much improved - cont current regimen  Meds ordered this encounter  Medications  . ketorolac (TORADOL) injection 60 mg    Sig:   . promethazine (PHENERGAN) injection 25 mg    Sig:   . ondansetron (ZOFRAN ODT) 4 MG disintegrating tablet    Sig: Take 1 tablet (4 mg total) by mouth every 8 (eight) hours as needed for nausea or vomiting.    Dispense:  20 tablet    Refill:  2  .  FLUoxetine (PROZAC) 40 MG capsule    Sig: Take 1 capsule (40 mg total) by mouth daily.    Dispense:  90 capsule    Refill:  1  . cyclobenzaprine (FLEXERIL) 10 MG tablet    Sig: Take 1 tablet (10 mg total) by mouth 3 (three) times daily as needed for muscle spasms.    Dispense:  30 tablet    Refill:  5  . butalbital-acetaminophen-caffeine (FIORICET) 50-325-40 MG per tablet    Sig: Take 1-2 tablets by mouth every 6 (six) hours as needed for headache.    Dispense:  20 tablet    Refill:  5  . buPROPion (WELLBUTRIN XL) 150 MG 24 hr tablet    Sig: Take 1 tablet (150 mg total) by mouth  daily.    Dispense:  90 tablet    Refill:  1  . amitriptyline (ELAVIL) 25 MG tablet    Sig: Take 1 tablet (25 mg total) by mouth at bedtime.    Dispense:  90 tablet    Refill:  1  . ALPRAZolam (XANAX) 0.25 MG tablet    Sig: Take 1 tablet (0.25 mg total) by mouth daily. At 5 pm.    Dispense:  30 tablet    Refill:  2    Not to exceed 5 additional fills before 12/19/2014.  . ibuprofen (ADVIL,MOTRIN) 800 MG tablet    Sig: Take 1 tablet (800 mg total) by mouth every 8 (eight) hours as needed for headache.    Dispense:  30 tablet    Refill:  3    I personally performed the services described in this documentation, which was scribed in my presence. The recorded information has been reviewed and considered, and addended by me as needed.  Norberto SorensonEva Crosby Bevan, MD MPH

## 2014-10-25 ENCOUNTER — Ambulatory Visit (INDEPENDENT_AMBULATORY_CARE_PROVIDER_SITE_OTHER): Payer: 59 | Admitting: Emergency Medicine

## 2014-10-25 VITALS — BP 146/84 | HR 123 | Temp 98.6°F | Resp 18 | Ht 64.5 in | Wt 201.8 lb

## 2014-10-25 DIAGNOSIS — F410 Panic disorder [episodic paroxysmal anxiety] without agoraphobia: Secondary | ICD-10-CM

## 2014-10-25 DIAGNOSIS — F4002 Agoraphobia without panic disorder: Secondary | ICD-10-CM

## 2014-10-25 DIAGNOSIS — K2990 Gastroduodenitis, unspecified, without bleeding: Secondary | ICD-10-CM | POA: Diagnosis not present

## 2014-10-25 DIAGNOSIS — R079 Chest pain, unspecified: Secondary | ICD-10-CM

## 2014-10-25 DIAGNOSIS — K2970 Gastritis, unspecified, without bleeding: Secondary | ICD-10-CM

## 2014-10-25 LAB — CBC WITH DIFFERENTIAL/PLATELET
BASOS ABS: 0 10*3/uL (ref 0.0–0.1)
Basophils Relative: 0 % (ref 0–1)
EOS ABS: 0.3 10*3/uL (ref 0.0–0.7)
EOS PCT: 3 % (ref 0–5)
HCT: 39.3 % (ref 36.0–46.0)
Hemoglobin: 13.7 g/dL (ref 12.0–15.0)
Lymphocytes Relative: 29 % (ref 12–46)
Lymphs Abs: 2.6 10*3/uL (ref 0.7–4.0)
MCH: 29.7 pg (ref 26.0–34.0)
MCHC: 34.9 g/dL (ref 30.0–36.0)
MCV: 85.2 fL (ref 78.0–100.0)
MONO ABS: 0.5 10*3/uL (ref 0.1–1.0)
MPV: 9.7 fL (ref 8.6–12.4)
Monocytes Relative: 5 % (ref 3–12)
Neutro Abs: 5.7 10*3/uL (ref 1.7–7.7)
Neutrophils Relative %: 63 % (ref 43–77)
Platelets: 297 10*3/uL (ref 150–400)
RBC: 4.61 MIL/uL (ref 3.87–5.11)
RDW: 13.8 % (ref 11.5–15.5)
WBC: 9 10*3/uL (ref 4.0–10.5)

## 2014-10-25 LAB — COMPREHENSIVE METABOLIC PANEL
ALT: 37 U/L — AB (ref 6–29)
AST: 26 U/L (ref 10–30)
Albumin: 4.3 g/dL (ref 3.6–5.1)
Alkaline Phosphatase: 100 U/L (ref 33–115)
BUN: 11 mg/dL (ref 7–25)
CALCIUM: 9.4 mg/dL (ref 8.6–10.2)
CHLORIDE: 107 mmol/L (ref 98–110)
CO2: 23 mmol/L (ref 20–31)
CREATININE: 0.78 mg/dL (ref 0.50–1.10)
Glucose, Bld: 99 mg/dL (ref 65–99)
Potassium: 4.5 mmol/L (ref 3.5–5.3)
Sodium: 139 mmol/L (ref 135–146)
Total Bilirubin: 0.3 mg/dL (ref 0.2–1.2)
Total Protein: 7.1 g/dL (ref 6.1–8.1)

## 2014-10-25 LAB — LIPASE: Lipase: 27 U/L (ref 7–60)

## 2014-10-25 MED ORDER — SUCRALFATE 1 G PO TABS: ORAL_TABLET | ORAL | Status: DC

## 2014-10-25 MED ORDER — LANSOPRAZOLE 30 MG PO CPDR: 30.0000 mg | DELAYED_RELEASE_CAPSULE | Freq: Every day | ORAL | Status: AC

## 2014-10-25 NOTE — Progress Notes (Signed)
Subjective:  Patient ID: Angela Price, female    DOB: 09-03-1980  Age: 34 y.o. MRN: [id number]  CC: Chest Pain; Shortness of Breath; and Headache   HPI Angela Price presents  with chest tightness and a sensation of shortness of breath. She has an established history of anxiety and panic disorder said that this episode of chest discomfort is more severe than she usually experiences and has as a consequence come in to be seen she denies any radiation of pain cough wheezing. She has no nasal congestion or postnasal nasal drainage. She is very anxious. She denies any acute stressors. But she describes herself as being quite anxious. And under a great deal pressure. She is a smoker. She drinks a lot of caffeine. She drinks only about 2 beers a week. She takes frequent doses of Tums but says that that doesn't really take care of her abdominal pain. She has some water brash. Denies any nausea or vomiting or blood in her stools or black stools. She denies excessive amounts of non-steroidal for aspirin.  History Angela Price has a past medical history of Multiple allergies; Anxiety; Depression; and Migraine headache.   She has past surgical history that includes Wisdom tooth extraction (03/1999).   Her  family history includes Cancer in her mother.  She   reports that she has been smoking Cigarettes.  She has a 1.5 pack-year smoking history. She has never used smokeless tobacco. She reports that she drinks alcohol. She reports that she does not use illicit drugs.  Outpatient Prescriptions Prior to Visit  Medication Sig Dispense Refill  . ALPRAZolam (XANAX) 0.25 MG tablet Take 1 tablet (0.25 mg total) by mouth daily. At 5 pm. 30 tablet 2  . amitriptyline (ELAVIL) 25 MG tablet Take 1 tablet (25 mg total) by mouth at bedtime. 90 tablet 1  . buPROPion (WELLBUTRIN XL) 150 MG 24 hr tablet Take 1 tablet (150 mg total) by mouth daily. 90 tablet 1  . butalbital-acetaminophen-caffeine (FIORICET) 50-325-40 MG per  tablet Take 1-2 tablets by mouth every 6 (six) hours as needed for headache. 20 tablet 5  . cyclobenzaprine (FLEXERIL) 10 MG tablet Take 1 tablet (10 mg total) by mouth 3 (three) times daily as needed for muscle spasms. 30 tablet 5  . FLUoxetine (PROZAC) 40 MG capsule Take 1 capsule (40 mg total) by mouth daily. 90 capsule 1  . ibuprofen (ADVIL,MOTRIN) 800 MG tablet Take 1 tablet (800 mg total) by mouth every 8 (eight) hours as needed for headache. 30 tablet 3  . medroxyPROGESTERone (DEPO-PROVERA) 150 MG/ML injection Inject 150 mg into the muscle every 3 (three) months.    . ondansetron (ZOFRAN ODT) 4 MG disintegrating tablet Take 1 tablet (4 mg total) by mouth every 8 (eight) hours as needed for nausea or vomiting. 20 tablet 2  . PROAIR RESPICLICK 108 (90 BASE) MCG/ACT AEPB Inhale 2 puffs into the lungs every 4 (four) hours as needed (wheezing). 1 each 5   No facility-administered medications prior to visit.    Social History   Social History  . Marital Status: Single    Spouse Name: N/A  . Number of Children: N/A  . Years of Education: N/A   Social History Main Topics  . Smoking status: Current Every Day Smoker -- 0.50 packs/day for 3 years    Types: Cigarettes  . Smokeless tobacco: Never Used  . Alcohol Use: 0.0 oz/week    0 Standard drinks or equivalent per week     Comment: 3  drinks per week  . Drug Use: No  . Sexual Activity: Not Asked   Other Topics Concern  . None   Social History Narrative     Review of Systems  Constitutional: Negative for fever, chills and appetite change.  HENT: Negative for congestion, ear pain, postnasal drip, sinus pressure and sore throat.   Eyes: Negative for pain and redness.  Respiratory: Positive for chest tightness and shortness of breath. Negative for cough and wheezing.   Cardiovascular: Negative for leg swelling.  Gastrointestinal: Positive for abdominal pain. Negative for nausea, vomiting, diarrhea, constipation and blood in stool.    Endocrine: Negative for polyuria.  Genitourinary: Negative for dysuria, urgency, frequency and flank pain.  Musculoskeletal: Negative for gait problem.  Skin: Negative for rash.  Neurological: Positive for headaches. Negative for weakness.  Psychiatric/Behavioral: Negative for confusion and decreased concentration. The patient is nervous/anxious.     Objective:  BP 146/84 mmHg  Pulse 123  Temp(Src) 98.6 F (37 C) (Oral)  Resp 18  Ht 5' 4.5" (1.638 m)  Wt 201 lb 12.8 oz (91.536 kg)  BMI 34.12 kg/m2  SpO2 97%  Physical Exam  Constitutional: She is oriented to person, place, and time. She appears well-developed and well-nourished. No distress.  HENT:  Head: Normocephalic and atraumatic.  Right Ear: External ear normal.  Left Ear: External ear normal.  Nose: Nose normal.  Eyes: Conjunctivae and EOM are normal. Pupils are equal, round, and reactive to light. No scleral icterus.  Neck: Normal range of motion. Neck supple. No tracheal deviation present.  Cardiovascular: Normal rate, regular rhythm and normal heart sounds.   Pulmonary/Chest: Effort normal. No respiratory distress. She has no wheezes. She has no rales.  Abdominal: She exhibits no mass. There is tenderness (Epigastrium). There is no rebound and no guarding.  Musculoskeletal: She exhibits no edema.  Lymphadenopathy:    She has no cervical adenopathy.  Neurological: She is alert and oriented to person, place, and time. Coordination normal.  Skin: Skin is warm and dry. No rash noted.  Psychiatric: She has a normal mood and affect. Her behavior is normal.      Assessment & Plan:   Angela Price was seen today for chest pain, shortness of breath and headache.  Diagnoses and all orders for this visit:  Chest pain, unspecified chest pain type -     EKG 12-Lead -     H. pylori breath test -     CBC with Differential/Platelet -     Comprehensive metabolic panel -     Lipase -     Ambulatory referral to Psychology  Panic  type anxiety neurosis -     CBC with Differential/Platelet -     Comprehensive metabolic panel -     Lipase -     Ambulatory referral to Psychology  Gastritis and gastroduodenitis -     CBC with Differential/Platelet -     Comprehensive metabolic panel -     Lipase -     Ambulatory referral to Psychology  Other orders -     lansoprazole (PREVACID) 30 MG capsule; Take 1 capsule (30 mg total) by mouth daily at 12 noon. -     sucralfate (CARAFATE) 1 G tablet; 1 tablet 1 hr ac and hs   I am having Ms. Ingram start on lansoprazole and sucralfate. I am also having her maintain her medroxyPROGESTERone, PROAIR RESPICLICK, ondansetron, FLUoxetine, cyclobenzaprine, butalbital-acetaminophen-caffeine, buPROPion, amitriptyline, ALPRAZolam, and ibuprofen.  Meds ordered this encounter  Medications  .  lansoprazole (PREVACID) 30 MG capsule    Sig: Take 1 capsule (30 mg total) by mouth daily at 12 noon.    Dispense:  30 capsule    Refill:  5  . sucralfate (CARAFATE) 1 G tablet    Sig: 1 tablet 1 hr ac and hs    Dispense:  120 tablet    Refill:  0   she had an EKG  read by the machine as atrial flutter. This certainly is not the case she has a regular sinus rhythm with normal P waves.  She denies any ulcer disease in her history. I think she's got at the very least gastritis and possibly reflux. She was put on medication and if that doesn't improve her symptoms then I suggested we send C see a gastroenterologist for evaluation for reflux. She had a negative ultrasound and nuclear medicine study year ago.  I also suggested to her that we get her in to see a therapist for her PTSD and anxiety and she is willing to do that   Appropriate red flag conditions were discussed with the patient as well as actions that should be taken.  Patient expressed his understanding.  Follow-up: Return if symptoms worsen or fail to improve.  Carmelina Dane, MD

## 2014-10-25 NOTE — Patient Instructions (Signed)

## 2014-10-26 LAB — H. PYLORI BREATH TEST: H. PYLORI BREATH TEST: NOT DETECTED

## 2014-11-08 ENCOUNTER — Ambulatory Visit (INDEPENDENT_AMBULATORY_CARE_PROVIDER_SITE_OTHER): Payer: 59 | Admitting: Diagnostic Neuroimaging

## 2014-11-08 ENCOUNTER — Encounter: Payer: Self-pay | Admitting: Diagnostic Neuroimaging

## 2014-11-08 VITALS — BP 119/76 | HR 106 | Ht 64.0 in | Wt 201.4 lb

## 2014-11-08 DIAGNOSIS — G43109 Migraine with aura, not intractable, without status migrainosus: Secondary | ICD-10-CM | POA: Diagnosis not present

## 2014-11-08 DIAGNOSIS — R440 Auditory hallucinations: Secondary | ICD-10-CM | POA: Diagnosis not present

## 2014-11-08 DIAGNOSIS — H539 Unspecified visual disturbance: Secondary | ICD-10-CM | POA: Diagnosis not present
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
## 2014-11-08 NOTE — Patient Instructions (Signed)
I will check MRI brain.  Continue current medications. 

## 2014-11-10 ENCOUNTER — Ambulatory Visit (INDEPENDENT_AMBULATORY_CARE_PROVIDER_SITE_OTHER): Payer: 59 | Admitting: Family Medicine

## 2014-11-10 VITALS — BP 112/78 | HR 107 | Temp 99.2°F | Ht 63.75 in | Wt 200.0 lb

## 2014-11-10 DIAGNOSIS — R112 Nausea with vomiting, unspecified: Secondary | ICD-10-CM

## 2014-11-10 DIAGNOSIS — G43111 Migraine with aura, intractable, with status migrainosus: Secondary | ICD-10-CM | POA: Diagnosis not present

## 2014-11-10 MED ORDER — KETOROLAC TROMETHAMINE 60 MG/2ML IM SOLN
60.0000 mg | Freq: Once | INTRAMUSCULAR | Status: AC
  Administered 2014-11-10: 60 mg via INTRAMUSCULAR

## 2014-11-10 MED ORDER — PROMETHAZINE HCL 25 MG PO TABS: 25.0000 mg | ORAL_TABLET | Freq: Three times a day (TID) | ORAL | Status: DC | PRN
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
## 2014-11-24 ENCOUNTER — Ambulatory Visit (HOSPITAL_COMMUNITY): Payer: No Typology Code available for payment source | Admitting: Psychiatry

## 2014-11-29 ENCOUNTER — Emergency Department (HOSPITAL_BASED_OUTPATIENT_CLINIC_OR_DEPARTMENT_OTHER)
Admission: EM | Admit: 2014-11-29 | Discharge: 2014-11-30 | Disposition: A | Discharge status: Home / Self Care | Payer: 59 | Attending: Emergency Medicine | Admitting: Emergency Medicine

## 2014-11-29 ENCOUNTER — Encounter (HOSPITAL_BASED_OUTPATIENT_CLINIC_OR_DEPARTMENT_OTHER): Payer: Self-pay

## 2014-11-29 DIAGNOSIS — S61215A Laceration without foreign body of left ring finger without damage to nail, initial encounter: Secondary | ICD-10-CM | POA: Insufficient documentation

## 2014-11-29 DIAGNOSIS — F419 Anxiety disorder, unspecified: Secondary | ICD-10-CM | POA: Insufficient documentation

## 2014-11-29 DIAGNOSIS — Z79899 Other long term (current) drug therapy: Secondary | ICD-10-CM | POA: Insufficient documentation

## 2014-11-29 DIAGNOSIS — G43909 Migraine, unspecified, not intractable, without status migrainosus: Secondary | ICD-10-CM | POA: Insufficient documentation

## 2014-11-29 DIAGNOSIS — Y9389 Activity, other specified: Secondary | ICD-10-CM | POA: Diagnosis not present

## 2014-11-29 DIAGNOSIS — Y998 Other external cause status: Secondary | ICD-10-CM | POA: Diagnosis not present

## 2014-11-29 DIAGNOSIS — S61219A Laceration without foreign body of unspecified finger without damage to nail, initial encounter: Secondary | ICD-10-CM

## 2014-11-29 DIAGNOSIS — Y9289 Other specified places as the place of occurrence of the external cause: Secondary | ICD-10-CM | POA: Diagnosis not present

## 2014-11-29 DIAGNOSIS — Z720 Tobacco use: Secondary | ICD-10-CM | POA: Insufficient documentation

## 2014-11-29 DIAGNOSIS — F329 Major depressive disorder, single episode, unspecified: Secondary | ICD-10-CM | POA: Diagnosis not present

## 2014-11-29 DIAGNOSIS — W290XXA Contact with powered kitchen appliance, initial encounter: Secondary | ICD-10-CM | POA: Insufficient documentation

## 2014-11-29 DIAGNOSIS — Z23 Encounter for immunization: Secondary | ICD-10-CM | POA: Insufficient documentation

## 2014-11-29 MED ORDER — ONDANSETRON 4 MG PO TBDP
4.0000 mg | ORAL_TABLET | Freq: Once | ORAL | Status: AC
  Administered 2014-11-29: 4 mg via ORAL
  Filled 2014-11-29: qty 1

## 2014-11-29 MED ORDER — IBUPROFEN 400 MG PO TABS
400.0000 mg | ORAL_TABLET | Freq: Once | ORAL | Status: AC
  Administered 2014-11-29: 400 mg via ORAL
  Filled 2014-11-29: qty 1

## 2014-11-29 MED ORDER — TETANUS-DIPHTH-ACELL PERTUSSIS 5-2.5-18.5 LF-MCG/0.5 IM SUSP
0.5000 mL | Freq: Once | INTRAMUSCULAR | Status: AC
  Administered 2014-11-29: 0.5 mL via INTRAMUSCULAR
  Filled 2014-11-29: qty 0.5
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
## 2014-11-29 NOTE — ED Notes (Signed)
Pt has crescent shaped laceration to left ring finger, bleeding controlled.

## 2014-11-29 NOTE — ED Notes (Signed)
Pt verbalizes understanding of d/c instructions and denies any further needs at this time. 

## 2014-11-29 NOTE — ED Notes (Signed)
Cut left ring finger on slicer approx 20 min PTA

## 2014-12-20 ENCOUNTER — Encounter: Payer: Self-pay | Admitting: Diagnostic Neuroimaging

## 2014-12-20 ENCOUNTER — Ambulatory Visit (INDEPENDENT_AMBULATORY_CARE_PROVIDER_SITE_OTHER): Payer: 59 | Admitting: Diagnostic Neuroimaging

## 2014-12-20 VITALS — BP 126/81 | HR 101 | Ht 63.75 in | Wt 206.0 lb

## 2014-12-20 DIAGNOSIS — H539 Unspecified visual disturbance: Secondary | ICD-10-CM | POA: Diagnosis not present

## 2014-12-20 DIAGNOSIS — G43109 Migraine with aura, not intractable, without status migrainosus: Secondary | ICD-10-CM | POA: Diagnosis not present

## 2014-12-20 DIAGNOSIS — R440 Auditory hallucinations: Secondary | ICD-10-CM | POA: Diagnosis not present
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
## 2014-12-20 NOTE — Patient Instructions (Signed)
Thank you for coming to see [place] at [facility]. I hope we have been able to provide you high quality care today.  You may receive a patient satisfaction survey over the next few weeks. We would appreciate your feedback and comments so that we may continue to improve ourselves and the health of our patients.   - MRI brain when able to afford - continue current medications - recommend referral back to psychiatry and psychology   ~~~~~~~~~~~~~~~~~~~~~~~~~~~~~~~~~~~~~~~~~~~~~~~~~~~~~~~~~~~~~~~~~  DR. Froilan Mclean'S GUIDE TO HAPPY AND HEALTHY LIVING These are some of my general health and wellness recommendations. Some of them may apply to you better than others. Please use common sense as you try these suggestions and feel free to ask me any questions.   ACTIVITY/FITNESS Mental, social, emotional and physical stimulation are very important for brain and body health. Try learning a new activity (arts, music, language, sports, games).  Keep moving your body to the best of your abilities. You can do this at home, inside or outside, the park, community center, gym or anywhere you like. Consider a physical therapist or personal trainer to get started. Consider the app Sworkit. Fitness trackers such as smart-watches, smart-phones or Fitbits can help as well.   NUTRITION Eat more plants: colorful vegetables, nuts, seeds and berries.  Eat less sugar, salt, preservatives and processed foods.  Avoid toxins such as cigarettes and alcohol.  Drink water when you are thirsty. Warm water with a slice of lemon is an excellent morning drink to start the day.  Consider these websites for more information [employer] (https://www.henry-hernandez.biz/) [employer] Nutrition (WindowBlog.ch)   RELAXATION Consider practicing mindfulness meditation or other relaxation techniques such as deep breathing, prayer, yoga, tai chi, massage. See  website mindful.org or the apps Headspace or Calm to help get started.   SLEEP Try to get at least 7-8+ hours sleep per day. Regular exercise and reduced caffeine will help you sleep better. Practice good sleep hygeine techniques. See website sleep.org for more information.   PLANNING Prepare estate planning, living will, healthcare POA documents. Sometimes this is best planned with the help of an attorney. Theconversationproject.org and agingwithdignity.org are excellent resources.

## 2015-01-10 ENCOUNTER — Ambulatory Visit (INDEPENDENT_AMBULATORY_CARE_PROVIDER_SITE_OTHER): Payer: 59 | Admitting: Family Medicine

## 2015-01-10 VITALS — BP 108/76 | HR 108 | Temp 99.1°F | Resp 18 | Ht 64.5 in | Wt 208.0 lb

## 2015-01-10 DIAGNOSIS — F32A Depression, unspecified: Secondary | ICD-10-CM

## 2015-01-10 DIAGNOSIS — G43109 Migraine with aura, not intractable, without status migrainosus: Secondary | ICD-10-CM

## 2015-01-10 DIAGNOSIS — Z5181 Encounter for therapeutic drug level monitoring: Secondary | ICD-10-CM

## 2015-01-10 DIAGNOSIS — F411 Generalized anxiety disorder: Secondary | ICD-10-CM | POA: Diagnosis not present

## 2015-01-10 DIAGNOSIS — F329 Major depressive disorder, single episode, unspecified: Secondary | ICD-10-CM | POA: Diagnosis not present

## 2015-01-10 DIAGNOSIS — G43111 Migraine with aura, intractable, with status migrainosus: Secondary | ICD-10-CM | POA: Diagnosis not present

## 2015-01-10 DIAGNOSIS — M542 Cervicalgia: Secondary | ICD-10-CM

## 2015-01-10 DIAGNOSIS — F4310 Post-traumatic stress disorder, unspecified: Secondary | ICD-10-CM

## 2015-01-10 DIAGNOSIS — R112 Nausea with vomiting, unspecified: Secondary | ICD-10-CM | POA: Diagnosis not present

## 2015-01-10 MED ORDER — PROMETHAZINE HCL 25 MG PO TABS: 25.0000 mg | ORAL_TABLET | Freq: Three times a day (TID) | ORAL | Status: DC | PRN

## 2015-01-10 MED ORDER — BUPROPION HCL ER (XL) 150 MG PO TB24: 150.0000 mg | ORAL_TABLET | Freq: Every day | ORAL | Status: DC

## 2015-01-10 MED ORDER — KETOROLAC TROMETHAMINE 60 MG/2ML IM SOLN
60.0000 mg | Freq: Once | INTRAMUSCULAR | Status: AC
  Administered 2015-01-10: 60 mg via INTRAMUSCULAR

## 2015-01-10 MED ORDER — ALPRAZOLAM 0.25 MG PO TABS: 0.2500 mg | ORAL_TABLET | Freq: Every day | ORAL | Status: DC

## 2015-01-10 MED ORDER — PROMETHAZINE HCL 25 MG/ML IJ SOLN
25.0000 mg | Freq: Once | INTRAMUSCULAR | Status: AC
  Administered 2015-01-10: 25 mg via INTRAMUSCULAR

## 2015-01-10 MED ORDER — AMITRIPTYLINE HCL 50 MG PO TABS: 50.0000 mg | ORAL_TABLET | Freq: Every day | ORAL | Status: DC
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
## 2015-01-10 NOTE — Patient Instructions (Signed)
Lets increase your elavil to 2 tabs a night. As long as you are not sleeping but not having any daytime fogginess we may want to try to push your dose up to get better migraine control and to get better improvement in your depression - should help increase your serotonin.  Ok to increase your xanax over the holidays.  Then we can see if we need to increase your prozac OR wellbutrin in the new year, we can see what is best for you. Please keep in touch and let me know how you are doing.  Lets recheck in 3 months - sooner if needed

## 2015-01-14 ENCOUNTER — Ambulatory Visit (INDEPENDENT_AMBULATORY_CARE_PROVIDER_SITE_OTHER): Payer: 59 | Admitting: Physician Assistant

## 2015-01-14 VITALS — BP 108/82 | HR 109 | Temp 98.8°F | Resp 16 | Ht 64.0 in | Wt 207.4 lb

## 2015-01-14 DIAGNOSIS — R300 Dysuria: Secondary | ICD-10-CM | POA: Diagnosis not present

## 2015-01-14 DIAGNOSIS — Z23 Encounter for immunization: Secondary | ICD-10-CM

## 2015-01-14 LAB — POCT URINALYSIS DIP (MANUAL ENTRY)
BILIRUBIN UA: NEGATIVE
BILIRUBIN UA: NEGATIVE
GLUCOSE UA: NEGATIVE
Leukocytes, UA: NEGATIVE
Nitrite, UA: NEGATIVE
PH UA: 7
Protein Ur, POC: NEGATIVE
RBC UA: NEGATIVE
SPEC GRAV UA: 1.015
Urobilinogen, UA: 0.2

## 2015-01-14 LAB — POC MICROSCOPIC URINALYSIS (UMFC): MUCUS RE: ABSENT

## 2015-01-14 MED ORDER — SULFAMETHOXAZOLE-TRIMETHOPRIM 800-160 MG PO TABS: 1.0000 | ORAL_TABLET | Freq: Two times a day (BID) | ORAL | Status: DC

## 2015-01-14 MED ORDER — FLUCONAZOLE 150 MG PO TABS: 150.0000 mg | ORAL_TABLET | Freq: Once | ORAL | Status: DC

## 2015-01-14 NOTE — Progress Notes (Signed)
Patient ID: Angela Price, female    DOB: 1980/12/10, 34 y.o.   MRN: [id number]  PCP: Rockne Coons, DO  Subjective:   Chief Complaint  Patient presents with  . Dysuria    this am  . Depression    positive depression screen    HPI Presents for evaluation of dysuria beginning about 2 hours ago.  Notes that she wore very tight clothing last night and "had lots of sex."  No hematuria. No urgency or frequency. No atypical vaginal discharge. No back or belly pain. No nausea/vomiting. Monogamous sex with one partner.  Depression screening score is 22. Feels stable in general, and depression is long-standing. Medications prescribed by another provider. Suicidal thoughts. No intention. Sees a therapist.  Review of Systems As above.    Patient Active Problem List   Diagnosis Date Noted  . Smoker 05/02/2014  . PTSD (post-traumatic stress disorder) 05/02/2014  . Migraine 02/17/2014  . Neck pain 02/17/2014  . Anxiety state 02/17/2014  . Depression 02/17/2014     Prior to Admission medications   Medication Sig Start Date End Date Taking? Authorizing Provider  ALPRAZolam (XANAX) 0.25 MG tablet Take 1 tablet (0.25 mg total) by mouth daily. At 5 pm. May take another tab as needed for increased anxiety. 01/10/15  Yes Sherren Mocha, MD  amitriptyline (ELAVIL) 50 MG tablet Take 1 tablet (50 mg total) by mouth at bedtime. 01/10/15  Yes Sherren Mocha, MD  buPROPion (WELLBUTRIN XL) 150 MG 24 hr tablet Take 1 tablet (150 mg total) by mouth daily. 01/10/15  Yes Sherren Mocha, MD  butalbital-acetaminophen-caffeine (FIORICET) [phone] MG per tablet Take 1-2 tablets by mouth every 6 (six) hours as needed for headache. 10/02/14 10/02/15 Yes Sherren Mocha, MD  cyclobenzaprine (FLEXERIL) 10 MG tablet Take 1 tablet (10 mg total) by mouth 3 (three) times daily as needed for muscle spasms. 10/02/14  Yes Sherren Mocha, MD  FLUoxetine (PROZAC) 40 MG capsule Take 1 capsule (40 mg total) by mouth daily. 10/02/14  Yes  Sherren Mocha, MD  ibuprofen (ADVIL,MOTRIN) 800 MG tablet Take 1 tablet (800 mg total) by mouth every 8 (eight) hours as needed for headache. 10/02/14  Yes Sherren Mocha, MD  lansoprazole (PREVACID) 30 MG capsule Take 1 capsule (30 mg total) by mouth daily at 12 noon. 10/25/14  Yes Carmelina Dane, MD  medroxyPROGESTERone (DEPO-PROVERA) 150 MG/ML injection Inject 150 mg into the muscle every 3 (three) months.   Yes Historical Provider, MD  ondansetron (ZOFRAN ODT) 4 MG disintegrating tablet Take 1 tablet (4 mg total) by mouth every 8 (eight) hours as needed for nausea or vomiting. 10/02/14  Yes Sherren Mocha, MD  PROAIR RESPICLICK 108 (90 BASE) MCG/ACT AEPB Inhale 2 puffs into the lungs every 4 (four) hours as needed (wheezing). 09/07/14  Yes Sherren Mocha, MD  sucralfate (CARAFATE) 1 G tablet 1 tablet 1 hr ac and hs 10/25/14  Yes Carmelina Dane, MD     Allergies  Allergen Reactions  . Imitrex [Sumatriptan]        Objective:  Physical Exam  Constitutional: She is oriented to person, place, and time. Vital signs are normal. She appears well-developed and well-nourished. No distress.  BP 108/82 mmHg  Pulse 109  Temp(Src) 98.8 F (37.1 C) (Oral)  Resp 16  Ht  (1.626 m)  Wt 207 lb 6.4 oz (94.076 kg)  BMI 35.58 kg/m2  SpO2 97%   HENT:  Head:  Normocephalic and atraumatic.  Cardiovascular: Normal rate, regular rhythm and normal heart sounds.   Pulmonary/Chest: Effort normal and breath sounds normal.  Abdominal: Soft. Normal appearance and bowel sounds are normal. She exhibits no distension and no mass. There is no hepatosplenomegaly. There is tenderness in the suprapubic area. There is no rigidity, no rebound, no guarding, no CVA tenderness, no tenderness at McBurney's point and negative Murphy's sign. No hernia.  Musculoskeletal: Normal range of motion.       Lumbar back: Normal.  Neurological: She is alert and oriented to person, place, and time.  Skin: Skin is warm and dry. No rash noted.  She is not diaphoretic. No pallor.  Psychiatric: She has a normal mood and affect. Her speech is normal and behavior is normal. Judgment and thought content normal. Cognition and memory are normal. She expresses no homicidal and no suicidal ideation. She expresses no suicidal plans and no homicidal plans.           Assessment & Plan:   1. Dysuria Await UCx. She can go ahead and start the antibiotic or wait until we get the culture results, but if she develops low back or belly pain, nausea, vomiting, fever/chills or urinary urgency, frequency or hematuria, start it promptly. - POCT Microscopic Urinalysis ([facility]) - POCT urinalysis dipstick - sulfamethoxazole-trimethoprim (BACTRIM DS,SEPTRA DS) 800-160 MG tablet; Take 1 tablet by mouth 2 (two) times daily.  Dispense: 10 tablet; Refill: 0 - fluconazole (DIFLUCAN) 150 MG tablet; Take 1 tablet (150 mg total) by mouth once. Repeat if needed  Dispense: 2 tablet; Refill: 0 - Urine culture  2. Need for influenza vaccination - Flu Vaccine QUAD 36+ mos IM   Fernande Brashelle S. Tameria Patti, PA-C Physician Assistant-Certified [facility]

## 2015-01-14 NOTE — Patient Instructions (Signed)
Lots of rest. Lots of fluids. It's OK to take OTC AZO STAT (or another similar product) for symptoms reduction until the antibiotic is working.

## 2015-01-15 ENCOUNTER — Telehealth: Payer: Self-pay

## 2015-01-15 ENCOUNTER — Ambulatory Visit (INDEPENDENT_AMBULATORY_CARE_PROVIDER_SITE_OTHER): Payer: 59 | Admitting: Family Medicine

## 2015-01-15 ENCOUNTER — Ambulatory Visit (INDEPENDENT_AMBULATORY_CARE_PROVIDER_SITE_OTHER): Payer: 59

## 2015-01-15 VITALS — BP 126/88 | HR 119 | Temp 98.7°F | Resp 20 | Ht 64.0 in | Wt 208.0 lb

## 2015-01-15 DIAGNOSIS — R509 Fever, unspecified: Secondary | ICD-10-CM | POA: Diagnosis not present

## 2015-01-15 DIAGNOSIS — R1084 Generalized abdominal pain: Secondary | ICD-10-CM

## 2015-01-15 DIAGNOSIS — R110 Nausea: Secondary | ICD-10-CM | POA: Diagnosis not present

## 2015-01-15 DIAGNOSIS — Z114 Encounter for screening for human immunodeficiency virus [HIV]: Secondary | ICD-10-CM

## 2015-01-15 LAB — POC MICROSCOPIC URINALYSIS (UMFC)

## 2015-01-15 LAB — POCT CBC
GRANULOCYTE PERCENT: 60.4 % (ref 37–80)
HEMATOCRIT: 39.7 % (ref 37.7–47.9)
HEMOGLOBIN: 13.2 g/dL (ref 12.2–16.2)
Lymph, poc: 2.1 (ref 0.6–3.4)
MCH, POC: 28.6 pg (ref 27–31.2)
MCHC: 33.2 g/dL (ref 31.8–35.4)
MCV: 86.1 fL (ref 80–97)
MID (cbc): 0.5 (ref 0–0.9)
MPV: 8.4 fL (ref 0–99.8)
PLATELET COUNT, POC: 247 10*3/uL (ref 142–424)
POC GRANULOCYTE: 3.9 (ref 2–6.9)
POC LYMPH %: 31.8 % (ref 10–50)
POC MID %: 7.8 %M (ref 0–12)
RBC: 4.62 M/uL (ref 4.04–5.48)
RDW, POC: 14.2 %
WBC: 6.5 10*3/uL (ref 4.6–10.2)

## 2015-01-15 LAB — URINE CULTURE

## 2015-01-15 MED ORDER — ONDANSETRON 4 MG PO TBDP
4.0000 mg | ORAL_TABLET | Freq: Once | ORAL | Status: AC
  Administered 2015-01-15: 4 mg via ORAL

## 2015-01-15 NOTE — Patient Instructions (Signed)
Get plenty of rest and drink at least 64 ounces of water daily. 

## 2015-01-15 NOTE — Telephone Encounter (Signed)
PT NEEDS OOW NOTE FOR TODAY,STATES SHE WENT TO WORK AND HAD TO LEAVE,TREATED YESTERDAY FOR UTI   BEST PHONE FOR PT IS [phone]

## 2015-01-15 NOTE — Progress Notes (Signed)
Patient ID: Angela Price, female    DOB: 07-06-1980, 34 y.o.   MRN: [id number]  PCP: Rockne Coons, DO  Subjective:   Chief Complaint  Patient presents with  . Follow-up    abdominal pain, bloating, feeling worse    HPI Presents for evaluation of worsening illness.  Was seen here yesterday with urinary discomfort. Urinalysis wasn't impressive, but we presumed that she had a UTI. Has not yet started the antibiotic prescribed yesterday for a possible UTI, as we discussed the option of waiting to start it until the culture results were available. Is using OTC AZO for comfort, which is working really well.  Tried going to work, made it through the first session, 90 minutes, and then had to leave.  Feels worse. Bloated. Pain moves around the abdomen, epigastrum, RLQ. Extreme fatigue, DOE. RIGHT sided frontal headache. Comes and goes. Tmax 101.2, ibuprofen helped. Throat feels sore. Has a slight cough, "wet," but unable to cough anything up. Ear discomfort. Feels achy in general, muscles and joints.  Diarrhea. Hasn't had much to eat. Nausea, but no vomiting ("I meant to take a Zofran, but I forgot.")  Received her flu vaccine yesterday.   Review of Systems As above.    Patient Active Problem List   Diagnosis Date Noted  . Smoker 05/02/2014  . PTSD (post-traumatic stress disorder) 05/02/2014  . Migraine 02/17/2014  . Neck pain 02/17/2014  . Anxiety state 02/17/2014  . Depression 02/17/2014     Prior to Admission medications   Medication Sig Start Date End Date Taking? Authorizing Provider  ALPRAZolam (XANAX) 0.25 MG tablet Take 1 tablet (0.25 mg total) by mouth daily. At 5 pm. May take another tab as needed for increased anxiety. 01/10/15  Yes Sherren Mocha, MD  amitriptyline (ELAVIL) 50 MG tablet Take 1 tablet (50 mg total) by mouth at bedtime. 01/10/15  Yes Sherren Mocha, MD  buPROPion (WELLBUTRIN XL) 150 MG 24 hr tablet Take 1 tablet (150 mg total) by mouth daily.  01/10/15  Yes Sherren Mocha, MD  butalbital-acetaminophen-caffeine (FIORICET) [phone] MG per tablet Take 1-2 tablets by mouth every 6 (six) hours as needed for headache. 10/02/14 10/02/15 Yes Sherren Mocha, MD  cyclobenzaprine (FLEXERIL) 10 MG tablet Take 1 tablet (10 mg total) by mouth 3 (three) times daily as needed for muscle spasms. 10/02/14  Yes Sherren Mocha, MD  fluconazole (DIFLUCAN) 150 MG tablet Take 1 tablet (150 mg total) by mouth once. Repeat if needed 01/14/15  Yes Levonne Carreras, PA-C  FLUoxetine (PROZAC) 40 MG capsule Take 1 capsule (40 mg total) by mouth daily. 10/02/14  Yes Sherren Mocha, MD  ibuprofen (ADVIL,MOTRIN) 800 MG tablet Take 1 tablet (800 mg total) by mouth every 8 (eight) hours as needed for headache. 10/02/14  Yes Sherren Mocha, MD  lansoprazole (PREVACID) 30 MG capsule Take 1 capsule (30 mg total) by mouth daily at 12 noon. 10/25/14  Yes Carmelina Dane, MD  medroxyPROGESTERone (DEPO-PROVERA) 150 MG/ML injection Inject 150 mg into the muscle every 3 (three) months.   Yes Historical Provider, MD  ondansetron (ZOFRAN ODT) 4 MG disintegrating tablet Take 1 tablet (4 mg total) by mouth every 8 (eight) hours as needed for nausea or vomiting. 10/02/14  Yes Sherren Mocha, MD  PROAIR RESPICLICK 108 (90 BASE) MCG/ACT AEPB Inhale 2 puffs into the lungs every 4 (four) hours as needed (wheezing). 09/07/14  Yes Sherren Mocha, MD  sucralfate (CARAFATE) 1 G tablet  1 tablet 1 hr ac and hs 10/25/14  Yes Carmelina DaneJeffery S Anderson, MD  sulfamethoxazole-trimethoprim (BACTRIM DS,SEPTRA DS) 800-160 MG tablet Take 1 tablet by mouth 2 (two) times daily. 01/14/15  Yes Ryaan Vanwagoner, PA-C     Allergies  Allergen Reactions  . Imitrex [Sumatriptan]        Objective:  Physical Exam  Constitutional: She is oriented to person, place, and time. Vital signs are normal. She appears well-developed and well-nourished. She is active and cooperative. No distress.  BP 126/88 mmHg  Pulse 119  Temp(Src) 98.7 F (37.1 C)  Resp  20  Ht 5\' 4"  (1.626 m)  Wt 208 lb (94.348 kg)  BMI 35.69 kg/m2  SpO2 97%  HENT:  Head: Atraumatic. Macrocephalic.  Right Ear: Hearing, tympanic membrane, external ear and ear canal normal.  Left Ear: Hearing, tympanic membrane, external ear and ear canal normal.  Nose: Nose normal.  Mouth/Throat: Uvula is midline, oropharynx is clear and moist and mucous membranes are normal. No oral lesions. Normal dentition.  Eyes: Conjunctivae, EOM and lids are normal. Pupils are equal, round, and reactive to light. No scleral icterus.  Neck: Normal range of motion and phonation normal. Neck supple. No thyromegaly present.  Cardiovascular: Regular rhythm, normal heart sounds and normal pulses.  Tachycardia present.   Pulses:      Radial pulses are 2+ on the right side, and 2+ on the left side.  Pulmonary/Chest: Effort normal and breath sounds normal.  Abdominal: There is no hepatosplenomegaly. There is tenderness in the epigastric area, suprapubic area, left upper quadrant and left lower quadrant. There is no CVA tenderness.  Lymphadenopathy:       Head (right side): No tonsillar, no preauricular, no posterior auricular and no occipital adenopathy present.       Head (left side): No tonsillar, no preauricular, no posterior auricular and no occipital adenopathy present.    She has no cervical adenopathy.       Right: No supraclavicular adenopathy present.       Left: No supraclavicular adenopathy present.  Neurological: She is alert and oriented to person, place, and time. No sensory deficit.  Skin: Skin is warm, dry and intact. No rash noted. No cyanosis or erythema. Nails show no clubbing.  Psychiatric: She has a normal mood and affect.   Acute Abdominal Series: [facility] reading (PRIMARY) by  Dr. Conley RollsLe. No free air. Mild scoliosis. Non-specific bowel gas pattern. No masses. Tiny hyperdensity noted in the pelvis, only noted on one view.  Results for orders placed or performed in visit on 01/15/15  POCT  CBC  Result Value Ref Range   WBC 6.5 4.6 - 10.2 K/uL   Lymph, poc 2.1 0.6 - 3.4   POC LYMPH PERCENT 31.8 10 - 50 %L   MID (cbc) 0.5 0 - 0.9   POC MID % 7.8 0 - 12 %M   POC Granulocyte 3.9 2 - 6.9   Granulocyte percent 60.4 37 - 80 %G   RBC 4.62 4.04 - 5.48 M/uL   Hemoglobin 13.2 12.2 - 16.2 g/dL   HCT, POC 16.139.7 09.637.7 - 47.9 %   MCV 86.1 80 - 97 fL   MCH, POC 28.6 27 - 31.2 pg   MCHC 33.2 31.8 - 35.4 g/dL   RDW, POC 04.514.2 %   Platelet Count, POC 247 142 - 424 K/uL   MPV 8.4 0 - 99.8 fL  POCT Microscopic Urinalysis ([facility])  Result Value Ref Range   WBC,UR,HPF,POC Few (A)  None WBC/hpf   RBC,UR,HPF,POC None None RBC/hpf   Bacteria Few (A) None, Too numerous to count   Mucus Present (A) Absent   Epithelial Cells, UR Per Microscopy Few (A) None, Too numerous to count cells/hpf    Attempted to start an IV for hydration, but was unsuccessful.        Assessment & Plan:   1. Generalized abdominal pain - POCT Microscopic Urinalysis ([facility]) - Comprehensive metabolic panel - DG Abd Acute W/Chest  2. Fever, unspecified fever cause - POCT CBC  3. Nausea without vomiting - ondansetron (ZOFRAN-ODT) disintegrating tablet 4 mg; Take 1 tablet (4 mg total) by mouth once.  4. Screening for HIV (human immunodeficiency virus) - HIV antibody   Unclear etiology of her current illness. Likely under-hydrated and may be developing a viral illness. Normal CBC and Urine microscopy, with culture results from yesterday revealing 20,000 CFU with not one morphotype predominant are reassuring. Rest. Hydrate. Monitor symptoms. Contact me if unable to RTW on Thursday 11/03, sooner if worsens.  Fernande Bras, PA-C Physician Assistant-Certified [facility]

## 2015-01-16 LAB — COMPREHENSIVE METABOLIC PANEL
ALBUMIN: 4.2 g/dL (ref 3.6–5.1)
ALK PHOS: 107 U/L (ref 33–115)
ALT: 82 U/L — AB (ref 6–29)
AST: 49 U/L — AB (ref 10–30)
BILIRUBIN TOTAL: 0.3 mg/dL (ref 0.2–1.2)
BUN: 12 mg/dL (ref 7–25)
CALCIUM: 9.5 mg/dL (ref 8.6–10.2)
CO2: 24 mmol/L (ref 20–31)
Chloride: 104 mmol/L (ref 98–110)
Creat: 0.89 mg/dL (ref 0.50–1.10)
GLUCOSE: 85 mg/dL (ref 65–99)
POTASSIUM: 4.5 mmol/L (ref 3.5–5.3)
Sodium: 138 mmol/L (ref 135–146)
TOTAL PROTEIN: 7 g/dL (ref 6.1–8.1)

## 2015-01-16 LAB — HIV ANTIBODY (ROUTINE TESTING W REFLEX): HIV 1&2 Ab, 4th Generation: NONREACTIVE

## 2015-01-16 NOTE — Progress Notes (Signed)
Agree with A/P. Dr Le 

## 2015-01-16 NOTE — Telephone Encounter (Signed)
Pt.notified

## 2015-01-16 NOTE — Telephone Encounter (Signed)
Note printed.

## 2015-01-17 ENCOUNTER — Ambulatory Visit (INDEPENDENT_AMBULATORY_CARE_PROVIDER_SITE_OTHER): Payer: 59 | Admitting: Family Medicine

## 2015-01-17 VITALS — BP 124/82 | HR 131 | Temp 99.3°F | Resp 16 | Ht 64.0 in | Wt 207.4 lb

## 2015-01-17 DIAGNOSIS — E860 Dehydration: Secondary | ICD-10-CM

## 2015-01-17 DIAGNOSIS — R509 Fever, unspecified: Secondary | ICD-10-CM

## 2015-01-17 DIAGNOSIS — R7401 Elevation of levels of liver transaminase levels: Secondary | ICD-10-CM

## 2015-01-17 DIAGNOSIS — R740 Nonspecific elevation of levels of transaminase and lactic acid dehydrogenase [LDH]: Secondary | ICD-10-CM

## 2015-01-17 DIAGNOSIS — G43801 Other migraine, not intractable, with status migrainosus: Secondary | ICD-10-CM

## 2015-01-17 DIAGNOSIS — B349 Viral infection, unspecified: Secondary | ICD-10-CM

## 2015-01-17 LAB — POCT CBC
Granulocyte percent: 44.5 %G (ref 37–80)
HCT, POC: 40.4 % (ref 37.7–47.9)
HEMOGLOBIN: 13.6 g/dL (ref 12.2–16.2)
Lymph, poc: 3.6 — AB (ref 0.6–3.4)
MCH: 28.6 pg (ref 27–31.2)
MCHC: 33.8 g/dL (ref 31.8–35.4)
MCV: 84.7 fL (ref 80–97)
MID (cbc): 1.1 — AB (ref 0–0.9)
MPV: 8.5 fL (ref 0–99.8)
PLATELET COUNT, POC: 213 10*3/uL (ref 142–424)
POC Granulocyte: 3.8 (ref 2–6.9)
POC LYMPH PERCENT: 42.9 %L (ref 10–50)
POC MID %: 12.6 % — AB (ref 0–12)
RBC: 4.77 M/uL (ref 4.04–5.48)
RDW, POC: 14.1 %
WBC: 8.5 10*3/uL (ref 4.6–10.2)

## 2015-01-17 LAB — POCT INFLUENZA A/B
Influenza A, POC: NEGATIVE
Influenza B, POC: NEGATIVE

## 2015-01-17 LAB — POCT SEDIMENTATION RATE: POCT SED RATE: 32 mm/hr — AB (ref 0–22)

## 2015-01-17 MED ORDER — PROMETHAZINE-CODEINE 6.25-10 MG/5ML PO SYRP: 5.0000 mL | ORAL_SOLUTION | Freq: Four times a day (QID) | ORAL | Status: DC | PRN

## 2015-01-17 NOTE — Patient Instructions (Signed)
Infectious Mononucleosis °Infectious mononucleosis is an infection caused by a virus. This illness is often called "mono." It causes symptoms that affect various areas of the body, including the throat, upper air passages, and lymph glands. The liver or spleen may also be affected. °The virus spreads from person to person through close contact. The illness is usually not serious and often goes away in 2-4 weeks without treatment. In rare cases, symptoms can be more severe and last longer, sometimes up to several months. Because the illness can sometimes cause the liver or spleen to become enlarged, you should not participate in contact sports or strenuous exercise until your health care provider approves. °CAUSES  °Infectious mononucleosis is caused by the Epstein-Barr virus. This virus spreads through contact with an infected person's saliva or other bodily fluids. It is often spread through kissing. It may also spread through coughing or sharing utensils or drinking glasses that were recently used by an infected person. An infected person will not always appear ill but can still spread the virus. °RISK FACTORS °This illness is most common in adolescents and young adults. °SIGNS AND SYMPTOMS  °The most common symptoms of infectious mononucleosis are: °· Sore throat.   °· Headache.   °· Fatigue.   °· Muscle aches.   °· Swollen glands.   °· Fever.   °· Poor appetite.   °· Enlarged liver or spleen.   °Some less common symptoms that can also occur include: °· Rash. This is more common if you take antibiotic medicines. °· Feeling sick to your stomach (nauseous).   °· Abdominal pain.   °DIAGNOSIS  °Your health care provider will take your medical history and do a physical exam. Blood tests can be done to confirm the diagnosis.  °TREATMENT  °Infectious mononucleosis usually goes away on its own with time. It cannot be cured with medicines, but medicines are sometimes used to relieve symptoms. Steroid medicine is sometimes  needed if the swelling in the throat causes breathing or swallowing problems. Treatment in a hospital is sometimes needed for severe cases.  °HOME CARE INSTRUCTIONS  °· Rest as needed.   °· Do not participate in contact sports, strenuous exercise, or heavy lifting until your health care provider approves. The liver and spleen could be seriously injured if they are enlarged from the illness. You may need to wait a couple months before participating in sports.   °· Drink enough fluid to keep your urine clear or pale yellow.   °· Do not drink alcohol. °· Take medicines only as directed by your health care provider. Children under 18 years of age should not take aspirin because of the association with Reye syndrome.   °· Eat soft foods. Cold foods such as ice cream or frozen ice pops can soothe a sore throat. °· If you have a sore throat, gargle with a mixture of salt and water. This may help relieve your discomfort. Mix 1 tsp of salt in 1 cup of warm water. Sucking on hard candy may also help.   °· Start regular activities gradually after the fever is gone. Be sure to rest when tired.   °· Avoid kissing or sharing utensils or drinking glasses until your health care provider tells you that you are no longer contagious.   °PREVENTION  °To avoid spreading the virus, do not kiss anyone or share utensils, drinking glasses, or food until your health care provider tells you that you are no longer contagious. °SEEK MEDICAL CARE IF:  °· Your fever is not gone after 10 days. °· You have swollen lymph nodes that are not   back to normal after 4 weeks. °· Your activity level is not back to normal after 2 months.   °· You have yellow coloring to your eyes and skin (jaundice). °· You have constipation.   °SEEK IMMEDIATE MEDICAL CARE IF:  °· You have severe pain in the abdomen or shoulder. °· You are drooling. °· You have trouble swallowing. °· You have trouble breathing. °· You develop a stiff neck. °· You develop a severe  headache. °· You cannot stop throwing up (vomiting). °· You have convulsions. °· You are confused. °· You have trouble with balance. °· You have signs of dehydration. These may include: °¨ Weakness. °¨ Sunken eyes. °¨ Pale skin. °¨ Dry mouth. °¨ Rapid breathing or pulse. °  °This information is not intended to replace advice given to you by your health care provider. Make sure you discuss any questions you have with your health care provider. °  °Document Released: 02/29/2000 Document Revised: 03/24/2014 Document Reviewed: 11/08/2013 °Elsevier Interactive Patient Education ©2016 [employer] ° °

## 2015-01-17 NOTE — Progress Notes (Signed)
Subjective:    Patient ID: Angela Price, female    DOB: July 07, 1980, 34 y.o.   MRN: [id number] This chart was scribed for [name], MD by Littie Deeds, Medical Scribe. This patient was seen in Room 1 and the patient's care was started at 12:37 PM.    Chief Complaint  Patient presents with  . Cough    x 3 days  . Sore Throat    x 3 days  . Generalized Body Aches    x 3 days  . Fatigue    x 3 days  . Headache    mirgriane  . Chills    HPI HPI Comments: Angela Price is a 34 y.o. female with a history of migraine and anxiety who presents to the [facility] complaining of gradual onset cough that started 3 days ago. Patient was seen in the office exactly 1 week ago with migraine symptoms. She returned 4 days later for UTI symptoms. She was back the following day for worsening GI symptoms. She was responding to pyridium. Suspected that she was dehydrated with developing viral illness, proceeded with watchful waiting. She did not start the Bactrim. She had mild transaminitis and normal CBC. No prior EBV labs in chart.  Patient reports having associated fatigue, generalized weakness (she has had difficulty making a fist), fever that started 3 days ago, postnasal drip, sore throat, and decreased appetite. Her temperature was 102.3 F this morning and subsequently took a 800 mg ibuprofen 3 hours ago. She is still having explosive diarrhea and some nausea; she has been taking the Zofran once a day which helps with the nausea. She still has a headache and did take ibuprofen 3 hours ago. She had an episode of chest pain yesterday and 3 days ago which she attributes to anxiety attacks; she took Xanax and was able to relieve her pain within an hour. Her urine has been normal. She notes that she has been pushing fluids. Patient denies known sick contacts, although she was at a party 4 days ago and notes that many of her friends have been diagnosed with mono. She denies known history of  mono.  Past Medical History  Diagnosis Date  . Multiple allergies   . Anxiety   . Depression   . Migraine headache   . History of panic attacks    Current Outpatient Prescriptions on File Prior to Visit  Medication Sig Dispense Refill  . ALPRAZolam (XANAX) 0.25 MG tablet Take 1 tablet (0.25 mg total) by mouth daily. At 5 pm. May take another tab as needed for increased anxiety. 60 tablet 2  . amitriptyline (ELAVIL) 50 MG tablet Take 1 tablet (50 mg total) by mouth at bedtime. 90 tablet 0  . buPROPion (WELLBUTRIN XL) 150 MG 24 hr tablet Take 1 tablet (150 mg total) by mouth daily. 90 tablet 1  . butalbital-acetaminophen-caffeine (FIORICET) 50-325-40 MG per tablet Take 1-2 tablets by mouth every 6 (six) hours as needed for headache. 20 tablet 5  . cyclobenzaprine (FLEXERIL) 10 MG tablet Take 1 tablet (10 mg total) by mouth 3 (three) times daily as needed for muscle spasms. 30 tablet 5  . FLUoxetine (PROZAC) 40 MG capsule Take 1 capsule (40 mg total) by mouth daily. 90 capsule 1  . ibuprofen (ADVIL,MOTRIN) 800 MG tablet Take 1 tablet (800 mg total) by mouth every 8 (eight) hours as needed for headache. 30 tablet 3  . lansoprazole (PREVACID) 30 MG capsule Take 1 capsule (30 mg  total) by mouth daily at 12 noon. 30 capsule 5  . medroxyPROGESTERone (DEPO-PROVERA) 150 MG/ML injection Inject 150 mg into the muscle every 3 (three) months.    . ondansetron (ZOFRAN ODT) 4 MG disintegrating tablet Take 1 tablet (4 mg total) by mouth every 8 (eight) hours as needed for nausea or vomiting. 20 tablet 2  . PROAIR RESPICLICK 108 (90 BASE) MCG/ACT AEPB Inhale 2 puffs into the lungs every 4 (four) hours as needed (wheezing). 1 each 5  . sucralfate (CARAFATE) 1 G tablet 1 tablet 1 hr ac and hs 120 tablet 0  . fluconazole (DIFLUCAN) 150 MG tablet Take 1 tablet (150 mg total) by mouth once. Repeat if needed (Patient not taking: Reported on 01/17/2015) 2 tablet 0   No current facility-administered medications on  file prior to visit.   Allergies  Allergen Reactions  . Imitrex [Sumatriptan]      Review of Systems  Constitutional: Positive for fever, chills, diaphoresis, activity change, appetite change and fatigue. Negative for unexpected weight change.  HENT: Positive for congestion, postnasal drip and sore throat. Negative for ear pain, rhinorrhea, sinus pressure and trouble swallowing.   Respiratory: Positive for cough. Negative for chest tightness, shortness of breath and wheezing.   Cardiovascular: Positive for chest pain. Negative for leg swelling.  Gastrointestinal: Positive for nausea, abdominal pain and diarrhea. Negative for vomiting, constipation and abdominal distention.  Genitourinary: Negative.   Skin: Negative for rash.  Neurological: Positive for weakness and headaches. Negative for dizziness.  Hematological: Positive for adenopathy.  Psychiatric/Behavioral: Positive for sleep disturbance.       Objective:  BP 124/82 mmHg  Pulse 131  Temp(Src) 99.3 F (37.4 C) (Oral)  Resp 16  Ht 5\' 4"  (1.626 m)  Wt 207 lb 6.4 oz (94.076 kg)  BMI 35.58 kg/m2  SpO2 98%  Physical Exam  Constitutional: She is oriented to person, place, and time. She appears well-developed and well-nourished. No distress.  HENT:  Head: Normocephalic and atraumatic.  Nose: Mucosal edema present.  Mouth/Throat: Oropharynx is clear and moist. No oropharyngeal exudate.  Nasal mucosal edema and congestion. Oropharynx with postnasal drip and cobblestoning.   Eyes: Pupils are equal, round, and reactive to light.  Neck: Neck supple.  Cardiovascular: Regular rhythm.  Tachycardia present.   Pulmonary/Chest: Effort normal.  Abdominal: There is no CVA tenderness.  Musculoskeletal: She exhibits no edema.  Neurological: She is alert and oriented to person, place, and time. No cranial nerve deficit.  Skin: Skin is warm and dry. No rash noted.  Psychiatric: She has a normal mood and affect. Her behavior is normal.   Nursing note and vitals reviewed.         Assessment & Plan:   1. Fever, unspecified   2. Dehydration   3. Transaminitis   4. Other migraine with status migrainosus, not intractable   5. Systemic viral illness   Unknown illness - suspect viral - could be mono - may be causing some temporary transaminitis. Advised symptomatic care, minimize tylenol containing cold meds, low fat diet. Recheck lfts in 1 mo if not worsening  Orders Placed This Encounter  Procedures  . Comprehensive metabolic panel  . Epstein-Barr virus VCA antibody panel  . POCT Influenza A/B  . POCT CBC  . POCT SEDIMENTATION RATE    Meds ordered this encounter  Medications  . promethazine-codeine (PHENERGAN WITH CODEINE) 6.25-10 MG/5ML syrup    Sig: Take 5-10 mLs by mouth every 6 (six) hours as needed for cough.  Dispense:  180 mL    Refill:  0    I personally performed the services described in this documentation, which was scribed in my presence. The recorded information has been reviewed and considered, and addended by me as needed.  [name], MD MPH   By signing my name below, I, Littie Deeds, attest that this documentation has been prepared under the direction and in the presence of [name], MD.  Electronically Signed: Littie Deeds, Medical Scribe. 01/17/2015. 12:37 PM.   Results for orders placed or performed in visit on 01/17/15  Comprehensive metabolic panel  Result Value Ref Range   Sodium 139 135 - 146 mmol/L   Potassium 4.5 3.5 - 5.3 mmol/L   Chloride 104 98 - 110 mmol/L   CO2 25 20 - 31 mmol/L   Glucose, Bld 94 65 - 99 mg/dL   BUN 10 7 - 25 mg/dL   Creat 1.61 0.96 - 0.45 mg/dL   Total Bilirubin 0.3 0.2 - 1.2 mg/dL   Alkaline Phosphatase 101 33 - 115 U/L   AST 52 (H) 10 - 30 U/L   ALT 88 (H) 6 - 29 U/L   Total Protein 6.7 6.1 - 8.1 g/dL   Albumin 4.0 3.6 - 5.1 g/dL   Calcium 9.4 8.6 - 40.9 mg/dL  Epstein-Barr virus VCA antibody panel  Result Value Ref Range   EBV VCA IgG 391.0 (H)  <18.0 U/mL   EBV VCA IgM <10.0 <36.0 U/mL   EBV EA IgG 23.0 (H) <9.0 U/mL   EBV NA IgG 317.0 (H) <18.0 U/mL  POCT Influenza A/B  Result Value Ref Range   Influenza A, POC Negative Negative   Influenza B, POC Negative Negative  POCT CBC  Result Value Ref Range   WBC 8.5 4.6 - 10.2 K/uL   Lymph, poc 3.6 (A) 0.6 - 3.4   POC LYMPH PERCENT 42.9 10 - 50 %L   MID (cbc) 1.1 (A) 0 - 0.9   POC MID % 12.6 (A) 0 - 12 %M   POC Granulocyte 3.8 2 - 6.9   Granulocyte percent 44.5 37 - 80 %G   RBC 4.77 4.04 - 5.48 M/uL   Hemoglobin 13.6 12.2 - 16.2 g/dL   HCT, POC 81.1 91.4 - 47.9 %   MCV 84.7 80 - 97 fL   MCH, POC 28.6 27 - 31.2 pg   MCHC 33.8 31.8 - 35.4 g/dL   RDW, POC 78.2 %   Platelet Count, POC 213 142 - 424 K/uL   MPV 8.5 0 - 99.8 fL  POCT SEDIMENTATION RATE  Result Value Ref Range   POCT SED RATE 32 (A) 0 - 22 mm/hr

## 2015-01-18 LAB — COMPREHENSIVE METABOLIC PANEL
ALBUMIN: 4 g/dL (ref 3.6–5.1)
ALT: 88 U/L — ABNORMAL HIGH (ref 6–29)
AST: 52 U/L — AB (ref 10–30)
Alkaline Phosphatase: 101 U/L (ref 33–115)
BILIRUBIN TOTAL: 0.3 mg/dL (ref 0.2–1.2)
BUN: 10 mg/dL (ref 7–25)
CHLORIDE: 104 mmol/L (ref 98–110)
CO2: 25 mmol/L (ref 20–31)
CREATININE: 0.84 mg/dL (ref 0.50–1.10)
Calcium: 9.4 mg/dL (ref 8.6–10.2)
GLUCOSE: 94 mg/dL (ref 65–99)
Potassium: 4.5 mmol/L (ref 3.5–5.3)
SODIUM: 139 mmol/L (ref 135–146)
Total Protein: 6.7 g/dL (ref 6.1–8.1)

## 2015-01-18 LAB — EPSTEIN-BARR VIRUS VCA ANTIBODY PANEL
EBV EA IgG: 23 U/mL — ABNORMAL HIGH (ref <9.0)
EBV NA IgG: 317 U/mL — ABNORMAL HIGH (ref <18.0)
EBV VCA IgG: 391 U/mL — ABNORMAL HIGH (ref <18.0)

## 2015-01-27 MED ORDER — BENZONATATE 200 MG PO CAPS: 200.0000 mg | ORAL_CAPSULE | Freq: Three times a day (TID) | ORAL | Status: DC | PRN

## 2015-01-28 ENCOUNTER — Ambulatory Visit (INDEPENDENT_AMBULATORY_CARE_PROVIDER_SITE_OTHER): Payer: 59 | Admitting: Family Medicine

## 2015-01-28 VITALS — BP 108/78 | HR 126 | Temp 99.1°F | Resp 20

## 2015-01-28 DIAGNOSIS — R509 Fever, unspecified: Secondary | ICD-10-CM

## 2015-01-28 DIAGNOSIS — R05 Cough: Secondary | ICD-10-CM | POA: Diagnosis not present

## 2015-01-28 DIAGNOSIS — R059 Cough, unspecified: Secondary | ICD-10-CM

## 2015-01-28 DIAGNOSIS — J0140 Acute pansinusitis, unspecified: Secondary | ICD-10-CM

## 2015-01-28 DIAGNOSIS — J029 Acute pharyngitis, unspecified: Secondary | ICD-10-CM

## 2015-01-28 DIAGNOSIS — Z8619 Personal history of other infectious and parasitic diseases: Secondary | ICD-10-CM | POA: Diagnosis not present

## 2015-01-29 ENCOUNTER — Ambulatory Visit: Payer: 59

## 2015-01-31 NOTE — Progress Notes (Signed)
      Chief Complaint:  Chief Complaint  Patient presents with  . Follow-up    Mono-not getting well, fever, cough , fatigue   Please see scanned media files from 01/31/2015 for encounter on 01/28/2015.

## 2015-03-26 ENCOUNTER — Other Ambulatory Visit: Payer: Self-pay | Admitting: Family Medicine

## 2015-04-09 ENCOUNTER — Other Ambulatory Visit: Payer: Self-pay | Admitting: *Deleted

## 2015-04-10 ENCOUNTER — Telehealth: Payer: Self-pay | Admitting: Oncology

## 2015-04-10 NOTE — Telephone Encounter (Signed)
there was a pof placed on this patient in error   Angela Price

## 2015-04-20 ENCOUNTER — Ambulatory Visit (INDEPENDENT_AMBULATORY_CARE_PROVIDER_SITE_OTHER): Payer: 59 | Admitting: Internal Medicine

## 2015-04-20 VITALS — BP 120/80 | HR 105 | Temp 98.0°F | Resp 20 | Ht 65.0 in | Wt 210.6 lb

## 2015-04-20 DIAGNOSIS — G43909 Migraine, unspecified, not intractable, without status migrainosus: Secondary | ICD-10-CM

## 2015-04-20 MED ORDER — KETOROLAC TROMETHAMINE 60 MG/2ML IM SOLN
60.0000 mg | Freq: Once | INTRAMUSCULAR | Status: AC
  Administered 2015-04-20: 60 mg via INTRAMUSCULAR

## 2015-04-20 MED ORDER — AMITRIPTYLINE HCL 100 MG PO TABS: 100.0000 mg | ORAL_TABLET | Freq: Every day | ORAL | Status: DC

## 2015-04-20 NOTE — Progress Notes (Signed)
Subjective:  By signing my name below, I, Angela Price, attest that this documentation has been prepared under the direction and in the presence of Tonye Pearson, MD Electronically Signed: Charline Bills, ED Scribe 04/20/2015 at 11:25 AM.   Patient ID: Angela Price, female    DOB: 24-Apr-1980, 35 y.o.   MRN: [id number]  Chief Complaint  Patient presents with  . Migraine    last night   HPI HPI Comments: Angela Price is a 35 y.o. female, with a h/o migraine HA, who presents to the [facility] complaining of gradually worsening left frontal HA onset 1 week ago, worsened since last night. Pt reports associated photophobia, mild nausea and intermittent dizziness onset this morning. She states that HA feels similar to previous migraines. Pt reports that migraines typically resolve with Toradol injection.   Sleep Disturbances   Pt also requests an increase in dose of Elavil from 50 mg to 100 mg. She has noticed fewer HAs since starting medication for sleep. Sleeping better--mood better--no side effects  Patient Active Problem List   Diagnosis Date Noted  . Smoker 05/02/2014  . PTSD (post-traumatic stress disorder) 05/02/2014  . Migraine 02/17/2014  . Neck pain 02/17/2014  . Anxiety state 02/17/2014  . Depression 02/17/2014    Past Medical History  Diagnosis Date  . Multiple allergies   . Anxiety   . Depression   . Migraine headache   . History of panic attacks    Current Outpatient Prescriptions on File Prior to Visit  Medication Sig Dispense Refill  . ALPRAZolam (XANAX) 0.25 MG tablet Take 1 tablet (0.25 mg total) by mouth daily. At 5 pm. May take another tab as needed for increased anxiety. 60 tablet 2  . amitriptyline (ELAVIL) 50 MG tablet Take 1 tablet (50 mg total) by mouth at bedtime. 90 tablet 0  . benzonatate (TESSALON) 200 MG capsule Take 1 capsule (200 mg total) by mouth 3 (three) times daily as needed for cough. 60 capsule 0  . buPROPion  (WELLBUTRIN XL) 150 MG 24 hr tablet Take 1 tablet (150 mg total) by mouth daily. 90 tablet 1  . butalbital-acetaminophen-caffeine (FIORICET) 50-325-40 MG per tablet Take 1-2 tablets by mouth every 6 (six) hours as needed for headache. 20 tablet 5  . cyclobenzaprine (FLEXERIL) 10 MG tablet Take 1 tablet (10 mg total) by mouth 3 (three) times daily as needed for muscle spasms. 30 tablet 5  . FLUoxetine (PROZAC) 40 MG capsule Take 1 capsule (40 mg total) by mouth daily. 90 capsule 1  . ibuprofen (ADVIL,MOTRIN) 800 MG tablet TAKE 1 TABLET BY MOUTH EVERY 8 HOURS AS NEEDED FOR HEADACHE 30 tablet 3  . lansoprazole (PREVACID) 30 MG capsule Take 1 capsule (30 mg total) by mouth daily at 12 noon. 30 capsule 5  . medroxyPROGESTERone (DEPO-PROVERA) 150 MG/ML injection Inject 150 mg into the muscle every 3 (three) months.    . ondansetron (ZOFRAN ODT) 4 MG disintegrating tablet Take 1 tablet (4 mg total) by mouth every 8 (eight) hours as needed for nausea or vomiting. 20 tablet 2  . PROAIR RESPICLICK 108 (90 BASE) MCG/ACT AEPB Inhale 2 puffs into the lungs every 4 (four) hours as needed (wheezing). 1 each 5  . sucralfate (CARAFATE) 1 G tablet 1 tablet 1 hr ac and hs 120 tablet 0   No current facility-administered medications on file prior to visit.   Allergies  Allergen Reactions  . Imitrex [Sumatriptan]    Review of Systems  Eyes: Positive for photophobia.  Gastrointestinal: Positive for nausea.  Neurological: Positive for dizziness and headaches.      Objective:   Physical Exam  Constitutional: She is oriented to person, place, and time. She appears well-developed and well-nourished. No distress.  HENT:  Head: Normocephalic and atraumatic.  Eyes: Conjunctivae and EOM are normal. Pupils are equal, round, and reactive to light.  Neck: Normal range of motion. Neck supple.  Cardiovascular: Normal rate, regular rhythm and normal heart sounds.   Pulmonary/Chest: Effort normal. No respiratory distress.    Musculoskeletal: Normal range of motion.  Neurological: She is alert and oriented to person, place, and time. She has normal reflexes. No cranial nerve deficit. Coordination normal.  Skin: Skin is warm and dry.  Psychiatric: She has a normal mood and affect. Her behavior is normal.  Nursing note and vitals reviewed.     Assessment & Plan:  Migraine without status migrainosus, not intractable, unspecified migraine type - Plan: ketorolac (TORADOL) injection 60 mg  Meds ordered this encounter  Medications  . ketorolac (TORADOL) injection 60 mg    Sig:   . amitriptyline (ELAVIL) 100 MG tablet    Sig: Take 1 tablet (100 mg total) by mouth at bedtime.    Dispense:  90 tablet    Refill:  1      I have completed the patient encounter in its entirety as documented by the scribe, with editing by me where necessary. Rosealynn Mateus P. Merla Riches, M.D.

## 2015-04-28 ENCOUNTER — Other Ambulatory Visit: Payer: Self-pay | Admitting: Family Medicine

## 2015-05-01 ENCOUNTER — Ambulatory Visit (INDEPENDENT_AMBULATORY_CARE_PROVIDER_SITE_OTHER): Payer: 59 | Admitting: Family Medicine

## 2015-05-01 VITALS — BP 122/88 | HR 110 | Temp 99.1°F | Resp 18 | Ht 65.0 in

## 2015-05-01 DIAGNOSIS — G43109 Migraine with aura, not intractable, without status migrainosus: Secondary | ICD-10-CM | POA: Diagnosis not present

## 2015-05-01 DIAGNOSIS — H538 Other visual disturbances: Secondary | ICD-10-CM

## 2015-05-01 DIAGNOSIS — H04122 Dry eye syndrome of left lacrimal gland: Secondary | ICD-10-CM

## 2015-05-01 MED ORDER — ONDANSETRON 4 MG PO TBDP: 4.0000 mg | ORAL_TABLET | Freq: Three times a day (TID) | ORAL | Status: DC | PRN

## 2015-05-01 MED ORDER — PROMETHAZINE HCL 25 MG/ML IJ SOLN
25.0000 mg | Freq: Once | INTRAMUSCULAR | Status: AC
  Administered 2015-05-01: 25 mg via INTRAMUSCULAR

## 2015-05-01 MED ORDER — KETOROLAC TROMETHAMINE 60 MG/2ML IM SOLN: 60.0000 mg | Freq: Once | INTRAMUSCULAR | Status: DC

## 2015-05-01 MED ORDER — KETOROLAC TROMETHAMINE 30 MG/ML IJ SOLN
30.0000 mg | Freq: Once | INTRAMUSCULAR | Status: AC
  Administered 2015-05-01: 30 mg via INTRAMUSCULAR

## 2015-05-01 NOTE — Patient Instructions (Signed)
Use an artificial tear product like Refresh or Systane - the active ingredient is usually hydroxymethylcellulose or polyethylene glycol. Use this every 2-3 hours - at least 4 times a day and whenever your eyes feel irritated, esp at night  Keratoconjunctivitis Sicca Keratoconjunctivitis sicca (dry eye) is dryness of the membranes surrounding the eye. It is caused by inadequate production of natural tears. The eyes must remain lubricated at all times. This creates a smooth surface of the cornea (clear covering at the front of the eye), which allows the eyelids to slide over the eye without causing soreness and irritation. A small amount of tears are constantly produced by the tear glands (lacrimal glands). These glands are located under the outside part of the upper eyelids. Dry eyes are often caused by decreased tear production. This condition is called aqueous tear-deficient dry eyes. The tear glands do not produce enough tears to keep the tissues surrounding the eye moist. This condition is common in postmenopausal women. Dry eyes may also be caused by an abnormality of how the tears are made. This can result in faster evaporation of the tears. It is called evaporative dry eyes. Although the tear glands produce enough tears, the rate of evaporation is so fast that the entire eye surface cannot be kept covered with a complete layer of tears. The condition can occur during certain activities or in unusually dry surroundings.  Sometimes dry eyes are symptoms of other diseases that can affect the eyes. Some examples are:   Rheumatoid arthritis.  Systemic lupus erythematosus (lupus). Chronic inflammatory disease, causing the body to attack its own tissue.  Sjogren syndrome. Dryness of the eyes and mouth, sometimes associated with a form of rheumatoid arthritis or lupus. SYMPTOMS  Symptoms of dry eyes include:  Irritation.  Itching.  Redness.  Burning and feeling as though there is something  gritty in the eye. If the surface of the eye becomes damaged, sensitivity may increase, along with increased discomfort and sensitivity to bright light. Symptoms are made worse by activities with decreased blinking, such as staring at a television, book, or computer screen.  Symptoms are also worse in dusty or smoky areas and in dry environments. Certain drugs can make symptoms worse, including antihistamines, tranquilizers, diuretics, antihypertensives (blood pressure medicine), and oral contraceptives. Symptoms tend to improve during cool, rainy, or foggy weather and in humid places, such as in the shower. DIAGNOSIS  Dry eyes are usually diagnosed by symptoms alone. Sometimes a Schirmer test is done, in which a strip of filter paper is placed at the edge of the eyelid. The amount of moisture bathing the eye is measured. This test is done by measuring how far the tears go up a strip of paper applied to the eye in a set amount of time. Your caregiver will use a special microscope to examine the surface of the eye and the cornea to see if there are signs of dryness. TREATMENT  Artificial tears (eye drops made with substances that simulate real tears) applied every few hours can generally control the problem. Lubricating ointments may help more severe cases. Avoiding dry, drafty environments and using humidifiers may also help. Minor surgery can be done to block the flow of tears into the nose, so that more tears are available to bathe the eyes. Patients with evaporative dry eyes may also benefit from treatment of the inflammation (redness and soreness) of the eyelids, which often occurs with the dry eyes. This treatment may include warm compresses, eyelid margin scrubs,  or oral medicines. PROGNOSIS  Even with severe dry eyes, it is uncommon to lose vision. At times, it may become difficult to see. Rarely, scarring and ulcers may occur, and blood vessels can grow across the cornea. Scarring and blood vessel  growth can impair vision. In severe cases, the cornea may become damaged or infected. Ulcers or infections of the cornea are serious complications. PREVENTION  There is no way to prevent getting keratoconjunctivitis sicca. However, complications can be prevented by keeping the eyes as moist as possible with artificial drops, as needed. SEEK IMMEDIATE MEDICAL CARE IF:   Your eye pain gets worse.  You develop a pus-like drainage from the eye.  The eye drops or medicines prescribed by your caregiver are not helping.  You have dry eyes and a sudden increase in discomfort or redness.  You have a sudden decrease in vision.  You have any other questions or concerns.   This information is not intended to replace advice given to you by your health care provider. Make sure you discuss any questions you have with your health care provider.   Document Released: 01/19/2004 Document Revised: 03/24/2014 Document Reviewed: 01/22/2009 Elsevier Interactive Patient Education [employer]
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
## 2015-05-12 ENCOUNTER — Ambulatory Visit (INDEPENDENT_AMBULATORY_CARE_PROVIDER_SITE_OTHER): Payer: 59 | Admitting: Family Medicine

## 2015-05-12 ENCOUNTER — Ambulatory Visit (INDEPENDENT_AMBULATORY_CARE_PROVIDER_SITE_OTHER): Payer: 59

## 2015-05-12 ENCOUNTER — Other Ambulatory Visit: Payer: Self-pay | Admitting: Family Medicine

## 2015-05-12 VITALS — BP 116/78 | HR 112 | Temp 99.5°F | Resp 12 | Ht 63.0 in | Wt 210.0 lb

## 2015-05-12 DIAGNOSIS — M25521 Pain in right elbow: Secondary | ICD-10-CM | POA: Diagnosis not present

## 2015-05-12 LAB — POCT CBC
Granulocyte percent: 68.7 %G (ref 37–80)
HEMATOCRIT: 39.1 % (ref 37.7–47.9)
Hemoglobin: 13.7 g/dL (ref 12.2–16.2)
LYMPH, POC: 2.1 (ref 0.6–3.4)
MCH, POC: 29.9 pg (ref 27–31.2)
MCHC: 35 g/dL (ref 31.8–35.4)
MCV: 85.4 fL (ref 80–97)
MID (cbc): 0.3 (ref 0–0.9)
MPV: 8.4 fL (ref 0–99.8)
POC GRANULOCYTE: 5.2 (ref 2–6.9)
POC LYMPH %: 27.8 % (ref 10–50)
POC MID %: 3.5 % (ref 0–12)
Platelet Count, POC: 259 10*3/uL (ref 142–424)
RBC: 4.58 M/uL (ref 4.04–5.48)
RDW, POC: 13.6 %
WBC: 7.5 10*3/uL (ref 4.6–10.2)

## 2015-05-12 LAB — POCT SEDIMENTATION RATE: POCT SED RATE: 30 mm/h — AB (ref 0–22)

## 2015-05-12 LAB — URIC ACID: Uric Acid, Serum: 5.3 mg/dL (ref 2.4–7.0)

## 2015-05-12 LAB — C-REACTIVE PROTEIN: CRP: 1.7 mg/dL — ABNORMAL HIGH (ref <0.60)

## 2015-05-12 NOTE — Patient Instructions (Addendum)
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  Radial Tunnel Syndrome With Rehab Radial tunnel syndrome is a condition of the nervous system in which the radial nerve is compressed by surrounding structures in the elbow or forearm. Weakness in the hand and wrist characterizes the condition. The particular branch of the medial nerve that is usually affected (posterior interosseous branch) does not include sensory nerve cells; therefore, this condition does not usually involve severe pain or numbness. SYMPTOMS   Diffuse pain in the forearm and hand during activity.  Decreased grip and forearm strength.  Outer (lateral) elbow tenderness.  Pain that worsens when rotating the wrist (using a screwdriver or opening a door). CAUSES  An increased pressure placed on the radial nerve causes radial tunnel syndrome. The compression usually occurs in the elbow or forearm by muscles and ligament-like tissue known as the interosseous membrane. The condition may also be caused by direct trauma to the elbow or forearm. RISK INCREASES WITH:  Activities that involve repetitive and/or strenuous wrist and forearm movements (tennis or carpentry).  Contact sports (football, soccer, lacrosse or rugby).  Poor strength and flexibility  Failure to warm-up properly before activity.  Diabetes mellitus.  Underactive thyroid gland (hypothyroidism ). PREVENTION   Warm up and stretch properly before activity.  Maintain physical fitness:  Strength, flexibility, and endurance.  Cardiovascular fitness.  Wear properly fitted and padded protective equipment (elbow pads and slash guards). PROGNOSIS  If treated properly, then the symptoms of radial tunnel syndrome typically resolve. Rarely, surgery is necessary to free the compressed nerve.  RELATED COMPLICATIONS   Permanent nerve damage that results in paralysis or weakness of the  forearm and hand.  Prolonged healing time, if improperly treated or re-injured.  Prolonged disability (uncommon). TREATMENT  Treatment initially involves resting from any activities that aggravate the symptoms. Ice and medications may be used to help reduce pain and inflammation. The use of strengthening and stretching exercises may help reduce pain with activity. These exercises may be performed at home or with referral to a therapist. If there are signs of muscle wasting (atrophy) or symptoms persist for greater than 6 months despite conservative (non-surgical) treatment, then surgery may be recommended. MEDICATION   If pain medication is necessary, then nonsteroidal anti-inflammatory medications, such as aspirin and ibuprofen, or other minor pain relievers, such as acetaminophen, are often recommended.  Do not take pain medication for 7 days before surgery.  Prescription pain relievers may be given if deemed necessary by your caregiver. Use only as directed and only as much as you need. HEAT AND COLD  Cold treatment (icing) relieves pain and reduces inflammation. Cold treatment should be applied for 10 to 15 minutes every 2 to 3 hours for inflammation and pain and immediately after any activity that aggravates your symptoms. Use ice packs or massage the area with a piece of ice (ice massage).  Heat treatment may be used prior to performing the stretching and strengthening activities prescribed by your caregiver, physical therapist, or athletic trainer. Use a heat pack or soak the injury in warm water. SEEK MEDICAL CARE IF:  Treatment seems to offer no benefit, or the condition worsens.  Any medications produce adverse side effects.  Any complications from surgery occur:  Pain, numbness, or coldness in the extremity operated upon.  Discoloration of the nail beds (they become blue or gray) of the extremity operated upon.  Signs of infections (fever,  pain, inflammation, redness, or  persistent bleeding). EXERCISES  RANGE OF MOTION (ROM) AND STRETCHING EXERCISES - Radial Tunnel Syndrome (Radial [Posterior Interosseous] Nerve) These exercises may help you when beginning to rehabilitate your injury. Your symptoms may resolve with or without further involvement from your physician, physical therapist or athletic trainer. While completing these exercises, remember:   Restoring tissue flexibility helps normal motion to return to the joints. This allows healthier, less painful movement and activity.  An effective stretch should be held for at least 30 seconds.  A stretch should never be painful. You should only feel a gentle lengthening or release in the stretched tissue. RANGE OF MOTION - Wrist Flexion, Active-Assisted  Extend your right / left elbow with your fingers pointing down.*  Gently pull the back of your hand towards you until you feel a gentle stretch on the top of your forearm.  Hold this position for __________ seconds. Repeat __________ times. Complete this exercise __________ times per day.  *If directed by your physician, physical therapist or athletic trainer, complete this stretch with your elbow bent rather than extended. STRETCH - Wrist Flexion  Place the back of your right / left hand on a tabletop leaving your elbow slightly bent. Your fingers should point away from your body.  Gently press the back of your hand down onto the table by straightening your elbow. You should feel a stretch on the top of your forearm.  Hold this position for __________ seconds. Repeat __________ times. Complete this stretch __________ times per day.  STRENGTHENING EXERCISES - Radial Tunnel Syndrome (Radial [Posterior Interosseous] Nerve) These exercises may help you when beginning to rehabilitate your injury. They may resolve your symptoms with or without further involvement from your physician, physical therapist or athletic trainer. While completing these exercises,  remember:   Muscles can gain both the endurance and the strength needed for everyday activities through controlled exercises.  Complete these exercises as instructed by your physician, physical therapist or athletic trainer. Progress the resistance and repetitions only as guided. STRENGTH - Wrist Extensors  Sit with your right / left forearm palm-down and fully supported. Your elbow should be resting below the height of your shoulder. Allow your wrist to extend over the edge of the surface.  Loosely holding a __________ weight or a piece of rubber exercise band/tubing, slowly curl your hand up toward your forearm.  Hold this position for __________ seconds. Slowly lower the wrist back to the starting position in a controlled manner. Repeat __________ times. Complete this exercise __________ times per day.  STRENGTH - Radial Deviators  Stand with a ____________________ weight in your right / left hand, or sit holding on to the rubber exercise band/tubing with your arm supported.  Raise your hand upward in front of you or pull up on the rubber tubing.  Hold this position for __________ seconds and then slowly lower the wrist back to the starting position. Repeat __________ times. Complete this exercise __________ times per day. STRENGTH - Grip   Grasp a tennis ball, a dense sponge, or a large, rolled sock in your hand.  Squeeze as hard as you can without increasing any pain.  Hold this position for __________ seconds. Release your grip slowly. Repeat __________ times. Complete this exercise __________ times per day.    This information is not intended to replace advice given to you by your health care provider. Make sure you discuss any questions you have with your health care provider.   Document Released:  03/03/2005 Document Revised: 07/18/2014 Document Reviewed: 06/15/2008 Elsevier Interactive Patient Education [employer]
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
## 2015-05-30 ENCOUNTER — Other Ambulatory Visit: Payer: Self-pay | Admitting: Internal Medicine

## 2015-05-30 ENCOUNTER — Other Ambulatory Visit: Payer: Self-pay | Admitting: Family Medicine

## 2015-06-28 ENCOUNTER — Encounter: Payer: Self-pay | Admitting: Family Medicine

## 2015-06-29 ENCOUNTER — Ambulatory Visit (INDEPENDENT_AMBULATORY_CARE_PROVIDER_SITE_OTHER): Payer: 59 | Admitting: Family Medicine

## 2015-06-29 VITALS — BP 126/94 | HR 90 | Temp 98.6°F | Resp 16 | Ht 63.0 in | Wt 213.6 lb

## 2015-06-29 DIAGNOSIS — R509 Fever, unspecified: Secondary | ICD-10-CM | POA: Diagnosis not present

## 2015-06-29 DIAGNOSIS — J45901 Unspecified asthma with (acute) exacerbation: Secondary | ICD-10-CM

## 2015-06-29 DIAGNOSIS — R5383 Other fatigue: Secondary | ICD-10-CM | POA: Diagnosis not present

## 2015-06-29 DIAGNOSIS — R05 Cough: Secondary | ICD-10-CM | POA: Diagnosis not present

## 2015-06-29 LAB — POCT CBC
GRANULOCYTE PERCENT: 67.1 % (ref 37–80)
HCT, POC: 37.2 % — AB (ref 37.7–47.9)
HEMOGLOBIN: 13.2 g/dL (ref 12.2–16.2)
Lymph, poc: 1.7 (ref 0.6–3.4)
MCH: 29.9 pg (ref 27–31.2)
MCHC: 35.4 g/dL (ref 31.8–35.4)
MCV: 84.4 fL (ref 80–97)
MID (CBC): 0.2 (ref 0–0.9)
MPV: 8.1 fL (ref 0–99.8)
PLATELET COUNT, POC: 233 10*3/uL (ref 142–424)
POC Granulocyte: 3.9 (ref 2–6.9)
POC LYMPH PERCENT: 28.7 %L (ref 10–50)
POC MID %: 4.2 %M (ref 0–12)
RBC: 4.41 M/uL (ref 4.04–5.48)
RDW, POC: 14.3 %
WBC: 5.8 10*3/uL (ref 4.6–10.2)

## 2015-06-29 LAB — POCT INFLUENZA A/B
Influenza A, POC: NEGATIVE
Influenza B, POC: NEGATIVE

## 2015-06-29 MED ORDER — PSEUDOEPHEDRINE HCL ER 120 MG PO TB12: 120.0000 mg | ORAL_TABLET | Freq: Two times a day (BID) | ORAL | Status: DC

## 2015-06-29 MED ORDER — HYDROCOD POLST-CPM POLST ER 10-8 MG/5ML PO SUER: 5.0000 mL | Freq: Two times a day (BID) | ORAL | Status: DC | PRN

## 2015-06-29 MED ORDER — BENZONATATE 200 MG PO CAPS: 200.0000 mg | ORAL_CAPSULE | Freq: Three times a day (TID) | ORAL | Status: DC | PRN

## 2015-06-29 MED ORDER — FLUCONAZOLE 150 MG PO TABS: 150.0000 mg | ORAL_TABLET | Freq: Once | ORAL | Status: DC

## 2015-06-29 MED ORDER — ALBUTEROL SULFATE (2.5 MG/3ML) 0.083% IN NEBU
2.5000 mg | INHALATION_SOLUTION | Freq: Once | RESPIRATORY_TRACT | Status: AC
  Administered 2015-06-29: 2.5 mg via RESPIRATORY_TRACT

## 2015-06-29 MED ORDER — AZITHROMYCIN 250 MG PO TABS: ORAL_TABLET | ORAL | Status: DC

## 2015-06-29 NOTE — Patient Instructions (Addendum)
   IF you received an x-ray today, you will receive an invoice from [facility]. Please contact  Radiology at [phone] with questions or concerns regarding your invoice.   IF you received labwork today, you will receive an invoice from [facility]. Please contact [facility] at [phone] with questions or concerns regarding your invoice.   Our billing staff will not be able to assist you with questions regarding bills from these companies.  You will be contacted with the lab results as soon as they are available. The fastest way to get your results is to activate your My Chart account. Instructions are located on the last page of this paperwork. If you have not heard from us regarding the results in 2 weeks, please contact this office.     Sinusitis, Adult Sinusitis is redness, soreness, and inflammation of the paranasal sinuses. Paranasal sinuses are air pockets within the bones of your face. They are located beneath your eyes, in the middle of your forehead, and above your eyes. In healthy paranasal sinuses, mucus is able to drain out, and air is able to circulate through them by way of your nose. However, when your paranasal sinuses are inflamed, mucus and air can become trapped. This can allow bacteria and other germs to grow and cause infection. Sinusitis can develop quickly and last only a short time (acute) or continue over a long period (chronic). Sinusitis that lasts for more than 12 weeks is considered chronic. CAUSES Causes of sinusitis include:  Allergies.  Structural abnormalities, such as displacement of the cartilage that separates your nostrils (deviated septum), which can decrease the air flow through your nose and sinuses and affect sinus drainage.  Functional abnormalities, such as when the small hairs (cilia) that line your sinuses and help remove mucus do not work properly or are not present. SIGNS AND SYMPTOMS Symptoms  of acute and chronic sinusitis are the same. The primary symptoms are pain and pressure around the affected sinuses. Other symptoms include:  Upper toothache.  Earache.  Headache.  Bad breath.  Decreased sense of smell and taste.  A cough, which worsens when you are lying flat.  Fatigue.  Fever.  Thick drainage from your nose, which often is green and may contain pus (purulent).  Swelling and warmth over the affected sinuses. DIAGNOSIS Your health care provider will perform a physical exam. During your exam, your health care provider may perform any of the following to help determine if you have acute sinusitis or chronic sinusitis:  Look in your nose for signs of abnormal growths in your nostrils (nasal polyps).  Tap over the affected sinus to check for signs of infection.  View the inside of your sinuses using an imaging device that has a light attached (endoscope). If your health care provider suspects that you have chronic sinusitis, one or more of the following tests may be recommended:  Allergy tests.  Nasal culture. A sample of mucus is taken from your nose, sent to a lab, and screened for bacteria.  Nasal cytology. A sample of mucus is taken from your nose and examined by your health care provider to determine if your sinusitis is related to an allergy. TREATMENT Most cases of acute sinusitis are related to a viral infection and will resolve on their own within 10 days. Sometimes, medicines are prescribed to help relieve symptoms of both acute and chronic sinusitis. These may include pain medicines, decongestants, nasal steroid sprays, or saline sprays. However, for sinusitis related   to a bacterial infection, your health care provider will prescribe antibiotic medicines. These are medicines that will help kill the bacteria causing the infection. Rarely, sinusitis is caused by a fungal infection. In these cases, your health care provider will prescribe antifungal  medicine. For some cases of chronic sinusitis, surgery is needed. Generally, these are cases in which sinusitis recurs more than 3 times per year, despite other treatments. HOME CARE INSTRUCTIONS  Drink plenty of water. Water helps thin the mucus so your sinuses can drain more easily.  Use a humidifier.  Inhale steam 3-4 times a day (for example, sit in the bathroom with the shower running).  Apply a warm, moist washcloth to your face 3-4 times a day, or as directed by your health care provider.  Use saline nasal sprays to help moisten and clean your sinuses.  Take medicines only as directed by your health care provider.  If you were prescribed either an antibiotic or antifungal medicine, finish it all even if you start to feel better. SEEK IMMEDIATE MEDICAL CARE IF:  You have increasing pain or severe headaches.  You have nausea, vomiting, or drowsiness.  You have swelling around your face.  You have vision problems.  You have a stiff neck.  You have difficulty breathing.   This information is not intended to replace advice given to you by your health care provider. Make sure you discuss any questions you have with your health care provider.   Document Released: 03/03/2005 Document Revised: 03/24/2014 Document Reviewed: 03/18/2011 Elsevier Interactive Patient Education 2016 Elsevier Inc. Upper Respiratory Infection, Adult Most upper respiratory infections (URIs) are a viral infection of the air passages leading to the lungs. A URI affects the nose, throat, and upper air passages. The most common type of URI is nasopharyngitis and is typically referred to as "the common cold." URIs run their course and usually go away on their own. Most of the time, a URI does not require medical attention, but sometimes a bacterial infection in the upper airways can follow a viral infection. This is called a secondary infection. Sinus and middle ear infections are common types of secondary  upper respiratory infections. Bacterial pneumonia can also complicate a URI. A URI can worsen asthma and chronic obstructive pulmonary disease (COPD). Sometimes, these complications can require emergency medical care and may be life threatening.  CAUSES Almost all URIs are caused by viruses. A virus is a type of germ and can spread from one person to another.  RISKS FACTORS You may be at risk for a URI if:   You smoke.   You have chronic heart or lung disease.  You have a weakened defense (immune) system.   You are very young or very old.   You have nasal allergies or asthma.  You work in crowded or poorly ventilated areas.  You work in health care facilities or schools. SIGNS AND SYMPTOMS  Symptoms typically develop 2-3 days after you come in contact with a cold virus. Most viral URIs last 7-10 days. However, viral URIs from the influenza virus (flu virus) can last 14-18 days and are typically more severe. Symptoms may include:   Runny or stuffy (congested) nose.   Sneezing.   Cough.   Sore throat.   Headache.   Fatigue.   Fever.   Loss of appetite.   Pain in your forehead, behind your eyes, and over your cheekbones (sinus pain).  Muscle aches.  DIAGNOSIS  Your health care provider may diagnose a URI  by:  Physical exam.  Tests to check that your symptoms are not due to another condition such as:  Strep throat.  Sinusitis.  Pneumonia.  Asthma. TREATMENT  A URI goes away on its own with time. It cannot be cured with medicines, but medicines may be prescribed or recommended to relieve symptoms. Medicines may help:  Reduce your fever.  Reduce your cough.  Relieve nasal congestion. HOME CARE INSTRUCTIONS   Take medicines only as directed by your health care provider.   Gargle warm saltwater or take cough drops to comfort your throat as directed by your health care provider.  Use a warm mist humidifier or inhale steam from a shower to  increase air moisture. This may make it easier to breathe.  Drink enough fluid to keep your urine clear or pale yellow.   Eat soups and other clear broths and maintain good nutrition.   Rest as needed.   Return to work when your temperature has returned to normal or as your health care provider advises. You may need to stay home longer to avoid infecting others. You can also use a face mask and careful hand washing to prevent spread of the virus.  Increase the usage of your inhaler if you have asthma.   Do not use any tobacco products, including cigarettes, chewing tobacco, or electronic cigarettes. If you need help quitting, ask your health care provider. PREVENTION  The best way to protect yourself from getting a cold is to practice good hygiene.   Avoid oral or hand contact with people with cold symptoms.   Wash your hands often if contact occurs.  There is no clear evidence that vitamin C, vitamin E, echinacea, or exercise reduces the chance of developing a cold. However, it is always recommended to get plenty of rest, exercise, and practice good nutrition.  SEEK MEDICAL CARE IF:   You are getting worse rather than better.   Your symptoms are not controlled by medicine.   You have chills.  You have worsening shortness of breath.  You have brown or red mucus.  You have yellow or brown nasal discharge.  You have pain in your face, especially when you bend forward.  You have a fever.  You have swollen neck glands.  You have pain while swallowing.  You have white areas in the back of your throat. SEEK IMMEDIATE MEDICAL CARE IF:   You have severe or persistent:  Headache.  Ear pain.  Sinus pain.  Chest pain.  You have chronic lung disease and any of the following:  Wheezing.  Prolonged cough.  Coughing up blood.  A change in your usual mucus.  You have a stiff neck.  You have changes in your:  Vision.  Hearing.  Thinking.  Mood. MAKE  SURE YOU:   Understand these instructions.  Will watch your condition.  Will get help right away if you are not doing well or get worse.   This information is not intended to replace advice given to you by your health care provider. Make sure you discuss any questions you have with your health care provider.   Document Released: 08/27/2000 Document Revised: 07/18/2014 Document Reviewed: 06/08/2013 Elsevier Interactive Patient Education [employer]2016 Elsevier Inc.

## 2015-06-29 NOTE — Progress Notes (Signed)
By signing my name below I, Shelah Lewandowsky, attest that this documentation has been prepared under the direction and in the presence of [name], MD. Electonically Signed. Shelah Lewandowsky, Scribe 06/29/2015 at 1:54 PM  Subjective:    Patient ID: Angela Price, female    DOB: 10/12/1980, 35 y.o.   MRN: [id number]  Chief Complaint  Patient presents with  . Fever    last fever was 102  . Generalized Body Aches  . congestion  . swollen glands  . Cough  . sinus pressure    HPI Angela Price is a 35 y.o. female who presents to the [facility] complaining of fever. Pt states that her last measured fever was 102 lat night. Pt also reports having generalized body aches, cough, congestion, sinus pressure, and swollen glands. Pt states that her symptoms started 2 days ago. Pt states that she had the flu shot.  Pt also reports having HAs this week that she thinks is due to sinus pressure and not her chronic migraines.  Pt states that 10 days ago she was at [facility] with her boyfriend's mother who was being treated for flu, MRSA, and sepsis. Pt witnessed the death of her boyfriend's mother at [facility].  Pt states that she had a abscess in her gums 5 months ago. Pt also reports having a bone spur on her jaw that she broke off and it healed on her own. Pt states she was taking amoxicillin which resolved her symptoms.    Pt is also reporting having a vagninal yeast infect and is declining treatment.  Pt also c/o having a recent ear piercing that is not healing.   Past Medical History  Diagnosis Date  . Multiple allergies   . Anxiety   . Depression   . Migraine headache   . History of panic attacks     Current outpatient prescriptions:  .  ALPRAZolam (XANAX) 0.25 MG tablet, TAKE 1 TABLET BY MOUTH EVERY DAY AT 5PM. MAY TAKE ANOTHER TABLET AS NEEDED FOR INCREASED ANXIETY, Disp: 60 tablet, Rfl: 2 .  amitriptyline (ELAVIL) 100 MG tablet, Take 1 tablet (100 mg total) by mouth at  bedtime., Disp: 90 tablet, Rfl: 1 .  benzonatate (TESSALON) 200 MG capsule, Take 1 capsule (200 mg total) by mouth 3 (three) times daily as needed for cough., Disp: 60 capsule, Rfl: 0 .  buPROPion (WELLBUTRIN XL) 150 MG 24 hr tablet, Take 1 tablet (150 mg total) by mouth daily., Disp: 90 tablet, Rfl: 1 .  butalbital-acetaminophen-caffeine (FIORICET, ESGIC) 50-325-40 MG tablet, TAKE 1 OR 2 TABLETS BY MOUTH EVERY 6 HOURS AS NEEDED, Disp: 20 tablet, Rfl: 5 .  cyclobenzaprine (FLEXERIL) 10 MG tablet, Take 1 tablet (10 mg total) by mouth 3 (three) times daily as needed for muscle spasms., Disp: 30 tablet, Rfl: 5 .  FLUoxetine (PROZAC) 40 MG capsule, TAKE 1 CAPSULE (40 MG TOTAL) BY MOUTH DAILY., Disp: 90 capsule, Rfl: 0 .  ibuprofen (ADVIL,MOTRIN) 800 MG tablet, TAKE 1 TABLET BY MOUTH EVERY 8 HOURS AS NEEDED FOR HEADACHE, Disp: 30 tablet, Rfl: 3 .  lansoprazole (PREVACID) 30 MG capsule, Take 1 capsule (30 mg total) by mouth daily at 12 noon., Disp: 30 capsule, Rfl: 5 .  medroxyPROGESTERone (DEPO-PROVERA) 150 MG/ML injection, Inject 150 mg into the muscle every 3 (three) months., Disp: , Rfl:  .  ondansetron (ZOFRAN ODT) 4 MG disintegrating tablet, Take 1 tablet (4 mg total) by mouth every 8 (eight) hours as needed  for nausea or vomiting., Disp: 20 tablet, Rfl: 2 .  PROAIR RESPICLICK 108 (90 BASE) MCG/ACT AEPB, Inhale 2 puffs into the lungs every 4 (four) hours as needed (wheezing)., Disp: 1 each, Rfl: 5 .  sucralfate (CARAFATE) 1 G tablet, 1 tablet 1 hr ac and hs, Disp: 120 tablet, Rfl: 0  Allergies  Allergen Reactions  . Imitrex [Sumatriptan]    Depression screen [facility] 2/9 06/29/2015 04/20/2015 04/20/2015 04/20/2015 01/17/2015  Decreased Interest 0 - - 1 0  Down, Depressed, Hopeless 0 - - 3 0  PHQ - 2 Score 0 - - 4 0  Altered sleeping - - - 1 -  Tired, decreased energy - - - 1 -  Change in appetite - - - 1 -  Feeling bad or failure about yourself  - - - 1 -  Trouble concentrating - - - 1 -  Moving slowly  or fidgety/restless - - - 0 -  Suicidal thoughts - 0 0 1 -  PHQ-9 Score - - - 10 -  Difficult doing work/chores - - - Somewhat difficult -        Review of Systems  Constitutional: Positive for fever, chills, diaphoresis, activity change, appetite change and fatigue. Negative for unexpected weight change.  HENT: Positive for congestion, postnasal drip, rhinorrhea, sinus pressure and sore throat. Negative for ear discharge, facial swelling, nosebleeds and voice change.   Eyes: Negative for pain.  Respiratory: Positive for cough, chest tightness and wheezing. Negative for shortness of breath.   Cardiovascular: Negative for chest pain.  Gastrointestinal: Negative for vomiting and abdominal pain.  Genitourinary: Negative for dysuria.  Musculoskeletal: Positive for myalgias.  Skin: Negative for rash.  Neurological: Positive for headaches.  Hematological: Positive for adenopathy.  Psychiatric/Behavioral: Positive for sleep disturbance. Negative for agitation.       Objective:  BP 126/94 mmHg  Pulse 90  Temp(Src) 98.6 F (37 C) (Oral)  Resp 16  Ht  (1.6 m)  Wt 213 lb 9.6 oz (96.888 kg)  BMI 37.85 kg/m2  SpO2 98%  Physical Exam  Constitutional: She is oriented to person, place, and time. She appears well-developed and well-nourished. She appears ill. No distress.  HENT:  Head: Normocephalic and atraumatic.  Right Ear: External ear and ear canal normal. Tympanic membrane is retracted. A middle ear effusion is present.  Left Ear: External ear and ear canal normal. Tympanic membrane is retracted. A middle ear effusion is present.  Nose: No mucosal edema or rhinorrhea. Right sinus exhibits frontal sinus tenderness. Right sinus exhibits no maxillary sinus tenderness. Left sinus exhibits frontal sinus tenderness. Left sinus exhibits no maxillary sinus tenderness.  Mouth/Throat: Uvula is midline and mucous membranes are normal. Posterior oropharyngeal erythema present. No  oropharyngeal exudate, posterior oropharyngeal edema or tonsillar abscesses.  Nares erythematous   Eyes: Conjunctivae are normal. Pupils are equal, round, and reactive to light. Right eye exhibits no discharge. Left eye exhibits no discharge. No scleral icterus.  Neck: Normal range of motion. Neck supple.  Cardiovascular: Normal rate, regular rhythm, normal heart sounds and intact distal pulses.   Pulmonary/Chest: Effort normal. She has wheezes in the right upper field, the right middle field, the left upper field and the left middle field.  Musculoskeletal: Normal range of motion.  Lymphadenopathy:       Head (right side): No submandibular, no preauricular and no posterior auricular adenopathy present.       Head (left side): No submandibular, no preauricular and no posterior auricular adenopathy present.  She has cervical adenopathy (anterior, bilat).       Right cervical: Superficial cervical adenopathy present.       Left cervical: Superficial cervical adenopathy present.       Right: No supraclavicular adenopathy present.       Left: No supraclavicular adenopathy present.  Neurological: She is alert and oriented to person, place, and time. Gait normal.  Skin: Skin is warm. She is diaphoretic. No erythema.  Psychiatric: She has a normal mood and affect. Her behavior is normal.  Nursing note and vitals reviewed.   Wheezing resolves and sxs improve after alb neb in office.  Results for orders placed or performed in visit on 06/29/15  POCT Influenza A/B  Result Value Ref Range   Influenza A, POC Negative Negative   Influenza B, POC Negative Negative  POCT CBC  Result Value Ref Range   WBC 5.8 4.6 - 10.2 K/uL   Lymph, poc 1.7 0.6 - 3.4   POC LYMPH PERCENT 28.7 10 - 50 %L   MID (cbc) 0.2 0 - 0.9   POC MID % 4.2 0 - 12 %M   POC Granulocyte 3.9 2 - 6.9   Granulocyte percent 67.1 37 - 80 %G   RBC 4.41 4.04 - 5.48 M/uL   Hemoglobin 13.2 12.2 - 16.2 g/dL   HCT, POC 14.737.2 (A) 82.937.7 -  47.9 %   MCV 84.4 80 - 97 fL   MCH, POC 29.9 27 - 31.2 pg   MCHC 35.4 31.8 - 35.4 g/dL   RDW, POC 56.214.3 %   Platelet Count, POC 233 142 - 424 K/uL   MPV 8.1 0 - 99.8 fL       Assessment & Plan:   1. Fever, unspecified   2. Reactive airway disease with acute exacerbation   Start alb inh qid.  Does not tolerate prednisone well due to mood side effects. Gave snap rx for zpack to fill if no improvement in 2-3d but suspect viral so symptomatic care for now.  Orders Placed This Encounter  Procedures  . POCT Influenza A/B  . POCT CBC    Meds ordered this encounter  Medications  . albuterol (PROVENTIL) (2.5 MG/3ML) 0.083% nebulizer solution 2.5 mg    Sig:   . benzonatate (TESSALON) 200 MG capsule    Sig: Take 1 capsule (200 mg total) by mouth 3 (three) times daily as needed for cough.    Dispense:  60 capsule    Refill:  0  . azithromycin (ZITHROMAX) 250 MG tablet    Sig: Take 2 tabs PO x 1 dose, then 1 tab PO QD x 4 days    Dispense:  6 tablet    Refill:  0  . chlorpheniramine-HYDROcodone (TUSSIONEX PENNKINETIC ER) 10-8 MG/5ML SUER    Sig: Take 5 mLs by mouth every 12 (twelve) hours as needed.    Dispense:  120 mL    Refill:  0  . fluconazole (DIFLUCAN) 150 MG tablet    Sig: Take 1 tablet (150 mg total) by mouth once. Repeat if needed after 3 days    Dispense:  2 tablet    Refill:  0  . pseudoephedrine (SUDAFED 12 HOUR) 120 MG 12 hr tablet    Sig: Take 1 tablet (120 mg total) by mouth 2 (two) times daily.    Dispense:  30 tablet    Refill:  0    I personally performed the services described in this documentation, which was scribed in my presence.  The recorded information has been reviewed and considered, and addended by me as needed.  Delman Cheadle, MD MPH

## 2015-08-04 ENCOUNTER — Other Ambulatory Visit: Payer: Self-pay | Admitting: Internal Medicine

## 2015-08-04 ENCOUNTER — Other Ambulatory Visit: Payer: Self-pay | Admitting: Family Medicine

## 2015-08-04 ENCOUNTER — Telehealth: Payer: Self-pay | Admitting: *Deleted

## 2015-08-04 NOTE — Telephone Encounter (Signed)
What is follow up plan for Prozac prescription.  Needs appoitment

## 2015-08-04 NOTE — Telephone Encounter (Signed)
Spoke with patient office visit is needed before next refill.  Patient understood

## 2015-08-08 ENCOUNTER — Ambulatory Visit (INDEPENDENT_AMBULATORY_CARE_PROVIDER_SITE_OTHER): Payer: 59 | Admitting: Family Medicine

## 2015-08-08 VITALS — BP 122/80 | HR 113 | Temp 98.5°F | Resp 17 | Ht 64.5 in | Wt 210.0 lb

## 2015-08-08 DIAGNOSIS — F411 Generalized anxiety disorder: Secondary | ICD-10-CM | POA: Diagnosis not present

## 2015-08-08 DIAGNOSIS — F329 Major depressive disorder, single episode, unspecified: Secondary | ICD-10-CM | POA: Diagnosis not present

## 2015-08-08 DIAGNOSIS — F4310 Post-traumatic stress disorder, unspecified: Secondary | ICD-10-CM | POA: Diagnosis not present

## 2015-08-08 DIAGNOSIS — G43109 Migraine with aura, not intractable, without status migrainosus: Secondary | ICD-10-CM

## 2015-08-08 DIAGNOSIS — F32A Depression, unspecified: Secondary | ICD-10-CM

## 2015-08-08 MED ORDER — ALPRAZOLAM ER 0.5 MG PO TB24: 0.5000 mg | ORAL_TABLET | Freq: Every day | ORAL | Status: DC

## 2015-08-08 MED ORDER — ALPRAZOLAM 0.25 MG PO TABS: ORAL_TABLET | ORAL | Status: DC

## 2015-08-08 MED ORDER — KETOROLAC TROMETHAMINE 60 MG/2ML IM SOLN
60.0000 mg | Freq: Once | INTRAMUSCULAR | Status: AC
  Administered 2015-08-08: 60 mg via INTRAMUSCULAR

## 2015-08-08 MED ORDER — FLUOXETINE HCL 20 MG PO CAPS: 60.0000 mg | ORAL_CAPSULE | Freq: Every day | ORAL | Status: DC

## 2015-08-08 NOTE — Patient Instructions (Signed)
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
## 2015-09-21 ENCOUNTER — Encounter: Payer: Self-pay | Admitting: Family Medicine

## 2015-10-05 ENCOUNTER — Ambulatory Visit: Payer: 59

## 2015-10-05 ENCOUNTER — Encounter (INDEPENDENT_AMBULATORY_CARE_PROVIDER_SITE_OTHER): Payer: Self-pay

## 2015-10-05 DIAGNOSIS — G43109 Migraine with aura, not intractable, without status migrainosus: Secondary | ICD-10-CM

## 2015-10-05 DIAGNOSIS — R1110 Vomiting, unspecified: Secondary | ICD-10-CM

## 2015-10-08 ENCOUNTER — Encounter: Payer: Self-pay | Admitting: Nurse Practitioner

## 2015-10-13 ENCOUNTER — Other Ambulatory Visit: Payer: Self-pay | Admitting: Family Medicine

## 2015-10-13 DIAGNOSIS — M79644 Pain in right finger(s): Secondary | ICD-10-CM

## 2015-10-13 DIAGNOSIS — M778 Other enthesopathies, not elsewhere classified: Secondary | ICD-10-CM

## 2015-10-13 DIAGNOSIS — T148XXA Other injury of unspecified body region, initial encounter: Secondary | ICD-10-CM

## 2015-10-13 DIAGNOSIS — S139XXA Sprain of joints and ligaments of unspecified parts of neck, initial encounter: Secondary | ICD-10-CM

## 2015-10-18 ENCOUNTER — Ambulatory Visit (INDEPENDENT_AMBULATORY_CARE_PROVIDER_SITE_OTHER): Payer: Self-pay | Admitting: Family Medicine

## 2015-10-18 ENCOUNTER — Encounter: Payer: Self-pay | Admitting: Family Medicine

## 2015-10-18 VITALS — BP 120/72 | HR 108 | Temp 99.5°F | Ht 64.0 in | Wt 206.0 lb

## 2015-10-18 DIAGNOSIS — G43119 Migraine with aura, intractable, without status migrainosus: Secondary | ICD-10-CM

## 2015-10-18 MED ORDER — KETOROLAC TROMETHAMINE 60 MG/2ML IM SOLN: 60.0000 mg | Freq: Once | INTRAMUSCULAR | Status: DC

## 2015-10-18 MED ORDER — KETOROLAC TROMETHAMINE 30 MG/ML IJ SOLN
60.0000 mg | Freq: Once | INTRAMUSCULAR | Status: AC
  Administered 2015-10-18: 60 mg via INTRAMUSCULAR

## 2015-10-18 MED ORDER — KETOROLAC TROMETHAMINE 30 MG/ML IJ SOLN: 60.0000 mg | Freq: Once | INTRAMUSCULAR | Status: DC

## 2015-10-18 NOTE — Progress Notes (Signed)
   Subjective:    Patient ID: Angela Price, female    DOB: 13-Sep-1980, 35 y.o.   MRN: [id number]  HPI  34 y.o. Female presents to [facility] c/o migraine headache/with aura/ with varying pain levels since yesterday. Pt has not taken any medications and pt is not on preventative migraine medication/  Pt denies nausea/c/o photosensitivity. Headache pain level 5-7. Aura [name] in [place], chin and arms appear large. Headaches not around hormonal chges per pt. Pt feels she has a hyperawareness body.     Review of Systems  Constitutional Positive for activity chg/unable to perform work duties Eyes Photophobia/no visual disturbances/ GI no N&V/abd pain Neuro: Headache/ no dizziness/no numbness or tingling  Psychiatric/Behavioral Pt is anxious/nervous/tearful at times. No confusions. Depressed mood       Objective:   Physical Exam Constitutional: Alert and oriented x 3 no acute distress Head Normocephalic and atraumatic/pain on both sides of head.  Eyes PERRL Neck supple CV HR regular Resp BBS audible and clear Neuro cranial nerves intact  Skin warm and face flushed Psychiatric: cooperative, pt discussed some stressful  family issues. Pt is not under the care of psychiatry.         Assessment & Plan:  Note for work x 2 days Toradol given Im/pt reports Toradol improves migraine and pt is okay to drive has driven after receiving toradol.  Follow up with primary care/recommendation to be seen by  Psychiatry.

## 2015-10-18 NOTE — Patient Instructions (Addendum)
Recurrent Migraine Headache A migraine headache is an intense, throbbing pain on one or both sides of your head. Recurrent migraines keep coming back. A migraine can last for 30 minutes to several hours. CAUSES  The exact cause of a migraine headache is not always known. However, a migraine may be caused when nerves in the brain become irritated and release chemicals that cause inflammation. This causes pain. Certain things may also trigger migraines, such as:   Alcohol.  Smoking.  Stress.  Menstruation.  Aged cheeses.  Foods or drinks that contain nitrates, glutamate, aspartame, or tyramine.  Lack of sleep.  Chocolate.  Caffeine.  Hunger.  Physical exertion.  Fatigue.  Medicines used to treat chest pain (nitroglycerine), birth control pills, estrogen, and some blood pressure medicines. SYMPTOMS   Pain on one or both sides of your head.  Pulsating or throbbing pain.  Severe pain that prevents daily activities.  Pain that is aggravated by any physical activity.  Nausea, vomiting, or both.  Dizziness.  Pain with exposure to bright lights, loud noises, or activity.  General sensitivity to bright lights, loud noises, or smells. Before you get a migraine, you may get warning signs that a migraine is coming (aura). An aura may include:  Seeing flashing lights.  Seeing bright spots, halos, or zigzag lines.  Having tunnel vision or blurred vision.  Having feelings of numbness or tingling.  Having trouble talking.  Having muscle weakness. DIAGNOSIS  A recurrent migraine headache is often diagnosed based on:  Symptoms.  Physical examination.  A CT scan or MRI of your head. These imaging tests cannot diagnose migraines but can help rule out other causes of headaches.  TREATMENT  Medicines may be given for pain and nausea. Medicines can also be given to help prevent recurrent migraines. HOME CARE INSTRUCTIONS  Only take over-the-counter or prescription  medicines for pain or discomfort as directed by your health care provider. The use of long-term narcotics is not recommended.  Lie down in a dark, quiet room when you have a migraine.  Keep a journal to find out what may trigger your migraine headaches. For example, write down:  What you eat and drink.  How much sleep you get.  Any change to your diet or medicines.  Limit alcohol consumption.  Quit smoking if you smoke.  Get 7-9 hours of sleep, or as recommended by your health care provider.  Limit stress.  Keep lights dim if bright lights bother you and make your migraines worse. SEEK MEDICAL CARE IF:   You do not get relief from the medicines given to you.  You have a recurrence of pain.  You have a fever. SEEK IMMEDIATE MEDICAL CARE IF:  Your migraine becomes severe.  You have a stiff neck.  You have loss of vision.  You have muscular weakness or loss of muscle control.  You start losing your balance or have trouble walking.  You feel faint or pass out.  You have severe symptoms that are different from your first symptoms. MAKE SURE YOU:   Understand these instructions.  Will watch your condition.  Will get help right away if you are not doing well or get worse.   This information is not intended to replace advice given to you by your health care provider. Make sure you discuss any questions you have with your health care provider.   Document Released: 11/26/2000 Document Revised: 03/24/2014 Document Reviewed: 11/08/2012 Elsevier Interactive Patient Education [employer]  Follow up with primary care and psychiatry.

## 2015-11-09 ENCOUNTER — Other Ambulatory Visit: Payer: Self-pay

## 2015-11-09 MED ORDER — AMITRIPTYLINE HCL 100 MG PO TABS: 100.0000 mg | ORAL_TABLET | Freq: Every day | ORAL | 0 refills | Status: DC

## 2015-11-17 ENCOUNTER — Other Ambulatory Visit: Payer: Self-pay | Admitting: Family Medicine

## 2015-11-26 NOTE — Telephone Encounter (Signed)
Called in xanax RF.

## 2016-01-11 ENCOUNTER — Encounter: Payer: Self-pay | Admitting: Family

## 2016-01-11 ENCOUNTER — Ambulatory Visit (INDEPENDENT_AMBULATORY_CARE_PROVIDER_SITE_OTHER): Payer: Self-pay | Admitting: Family

## 2016-01-11 VITALS — BP 106/82 | HR 99 | Temp 98.8°F | Wt 205.0 lb

## 2016-01-11 DIAGNOSIS — J0100 Acute maxillary sinusitis, unspecified: Secondary | ICD-10-CM

## 2016-01-11 DIAGNOSIS — G43109 Migraine with aura, not intractable, without status migrainosus: Secondary | ICD-10-CM

## 2016-01-11 MED ORDER — AMOXICILLIN-POT CLAVULANATE 875-125 MG PO TABS: 1.0000 | ORAL_TABLET | Freq: Two times a day (BID) | ORAL | 0 refills | Status: DC

## 2016-01-11 MED ORDER — KETOROLAC TROMETHAMINE 30 MG/ML IJ SOLN: 30.0000 mg | Freq: Once | INTRAMUSCULAR | Status: AC

## 2016-01-11 MED ORDER — KETOROLAC TROMETHAMINE 30 MG/ML IJ SOLN
30.0000 mg | Freq: Once | INTRAMUSCULAR | Status: AC
  Administered 2016-01-11: 30 mg via INTRAMUSCULAR

## 2016-01-11 MED ORDER — FLUTICASONE PROPIONATE 50 MCG/ACT NA SUSP: 2.0000 | Freq: Every day | NASAL | 6 refills | Status: AC

## 2016-01-11 MED ORDER — KETOROLAC TROMETHAMINE 60 MG/2ML IM SOLN: 60.0000 mg | Freq: Once | INTRAMUSCULAR | Status: AC

## 2016-01-11 NOTE — Patient Instructions (Signed)

## 2016-01-11 NOTE — Addendum Note (Signed)
Addended by: Alfred LevinsWYRICK, CINDY D on: 01/11/2016 01:10 PM   Modules accepted: Orders

## 2016-01-11 NOTE — Progress Notes (Signed)
Subjective:    Patient ID: Angela Price, female    DOB: 03-Aug-1980, 35 y.o.   MRN: [id number]  Headache   This is a new problem. The current episode started in the past 7 days. The problem occurs intermittently. The problem has been waxing and waning. The pain is located in the frontal region. The pain does not radiate. The pain quality is similar to prior headaches. The quality of the pain is described as aching. Associated symptoms include coughing, phonophobia, photophobia, sinus pressure and a sore throat. Pertinent negatives include no ear pain, nausea or vomiting.  Sinusitis  This is a new problem. The current episode started 1 to 4 weeks ago. The problem has been gradually worsening since onset. The maximum temperature recorded prior to her arrival was 101 - 101.9 F. Her pain is at a severity of 8/10. The pain is moderate. Associated symptoms include congestion, coughing, headaches, a hoarse voice, sinus pressure and a sore throat. Pertinent negatives include no chills, ear pain or sneezing. Past treatments include acetaminophen, oral decongestants and lying down. The treatment provided mild relief.      Review of Systems  Constitutional: Negative for chills.  HENT: Positive for congestion, hoarse voice, sinus pressure and sore throat. Negative for ear pain and sneezing.   Eyes: Positive for photophobia.  Respiratory: Positive for cough.   Gastrointestinal: Negative for nausea and vomiting.  Neurological: Positive for headaches.  All other systems reviewed and are negative.      Objective:   Physical Exam  Constitutional: She is oriented to person, place, and time. She appears well-developed and well-nourished. No distress.  HENT:  Head: Normocephalic and atraumatic.  Right Ear: External ear normal.  Left Ear: External ear normal.  Nose: Mucosal edema and rhinorrhea present. Right sinus exhibits maxillary sinus tenderness and frontal sinus tenderness. Left sinus exhibits  maxillary sinus tenderness and frontal sinus tenderness.  Mouth/Throat: Posterior oropharyngeal erythema present.  Eyes: Pupils are equal, round, and reactive to light.  Neck: Normal range of motion. Neck supple. No thyromegaly present.  Cardiovascular: Normal rate, regular rhythm, normal heart sounds and intact distal pulses.   No murmur heard. Pulmonary/Chest: Effort normal and breath sounds normal. No respiratory distress. She has no wheezes.  Abdominal: Soft. Bowel sounds are normal. She exhibits no distension. There is no tenderness.  Musculoskeletal: Normal range of motion. She exhibits no edema or tenderness.  Neurological: She is alert and oriented to person, place, and time. She has normal reflexes. No cranial nerve deficit.  Skin: Skin is warm and dry.  Psychiatric: She has a normal mood and affect. Her behavior is normal. Judgment and thought content normal.  Vitals reviewed.     BP 106/82 (BP Location: Right Arm, Patient Position: Sitting, Cuff Size: Normal)   Pulse 99   Temp 98.8 F (37.1 C) (Oral)   Wt 205 lb (93 kg)   BMI 35.19 kg/m      Assessment & Plan:  1. Acute maxillary sinusitis, recurrence not specified -- Take meds as prescribed - Use a cool mist humidifier  -Use saline nose sprays frequently -Saline irrigations of the nose can be very helpful if done frequently.  * 4X daily for 1 week*  * Use of a nettie pot can be helpful with this. Follow directions with this* -Force fluids -For any cough or congestion  Use plain Mucinex- regular strength or max strength is fine   * Children- consult with Pharmacist for dosing -For fever or aces or  pains- take tylenol or ibuprofen appropriate for age and weight.  * for fevers greater than 101 orally you may alternate ibuprofen and tylenol every  3 hours. -Throat lozenges if help - amoxicillin-clavulanate (AUGMENTIN) 875-125 MG tablet; Take 1 tablet by mouth 2 (two) times daily.  Dispense: 14 tablet; Refill: 0 -  fluticasone (FLONASE) 50 MCG/ACT nasal spray; Place 2 sprays into both nostrils daily.  Dispense: 16 g; Refill: 6  2. Migraine with aura and without status migrainosus, not intractable -Stress management discussed -Avoid caffeine  -Encourage 8-9 hours of sleep -Keep neurologists appts - ketorolac (TORADOL) injection 60 mg; Inject 2 mLs (60 mg total) into the muscle once.  Jannifer Rodneyhristy Brent Taillon, FNP

## 2016-02-25 ENCOUNTER — Other Ambulatory Visit: Payer: Self-pay | Admitting: Family Medicine

## 2016-03-04 ENCOUNTER — Other Ambulatory Visit: Payer: Self-pay | Admitting: Family Medicine

## 2016-03-05 NOTE — Telephone Encounter (Signed)
Needs OV for any additional refills. Please make sure clinical talks to pt to confirm that her mood is stable and she has plans for f/u within the mo or that she has responded to my MyChart email prior to calling or faxing this rx in.

## 2016-03-05 NOTE — Telephone Encounter (Signed)
Called pt and had to LM. Asked her to check her Mychart email and respond to Dr Clelia CroftShaw directly or call me back. I will wait to send in her Rx until we hear from her.
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
## 2016-03-06 NOTE — Telephone Encounter (Signed)
Called to pharmacy 

## 2016-03-30 ENCOUNTER — Other Ambulatory Visit: Payer: Self-pay | Admitting: Family Medicine

## 2016-04-18 ENCOUNTER — Encounter: Payer: Self-pay | Admitting: Family Medicine

## 2016-04-18 ENCOUNTER — Other Ambulatory Visit: Payer: Self-pay | Admitting: Family Medicine

## 2016-04-18 DIAGNOSIS — M778 Other enthesopathies, not elsewhere classified: Secondary | ICD-10-CM

## 2016-04-18 DIAGNOSIS — M79644 Pain in right finger(s): Secondary | ICD-10-CM

## 2016-04-18 DIAGNOSIS — T148XXA Other injury of unspecified body region, initial encounter: Secondary | ICD-10-CM

## 2016-04-19 MED ORDER — IBUPROFEN 800 MG PO TABS
800.0000 mg | ORAL_TABLET | Freq: Three times a day (TID) | ORAL | 3 refills | Status: DC | PRN
Start: 2016-04-19 — End: 2016-05-12

## 2016-04-19 MED ORDER — BUTALBITAL-APAP-CAFFEINE 50-325-40 MG PO TABS: 1.0000 | ORAL_TABLET | Freq: Four times a day (QID) | ORAL | 3 refills | Status: DC | PRN

## 2016-04-19 MED ORDER — ALPRAZOLAM 0.25 MG PO TABS: 0.2500 mg | ORAL_TABLET | Freq: Two times a day (BID) | ORAL | 3 refills | Status: DC | PRN

## 2016-04-19 MED ORDER — FLUOXETINE HCL 20 MG PO CAPS: 60.0000 mg | ORAL_CAPSULE | Freq: Every day | ORAL | 3 refills | Status: DC

## 2016-04-19 MED ORDER — ALPRAZOLAM ER 0.5 MG PO TB24: 0.5000 mg | ORAL_TABLET | Freq: Every day | ORAL | 3 refills | Status: DC

## 2016-04-19 NOTE — Telephone Encounter (Signed)
Last ov 07/2015

## 2016-04-21 NOTE — Telephone Encounter (Signed)
[facility]
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
## 2016-05-12 ENCOUNTER — Ambulatory Visit (INDEPENDENT_AMBULATORY_CARE_PROVIDER_SITE_OTHER): Payer: Self-pay | Admitting: Family Medicine

## 2016-05-12 ENCOUNTER — Encounter: Payer: Self-pay | Admitting: Family Medicine

## 2016-05-12 VITALS — BP 134/81 | HR 108 | Temp 98.1°F | Resp 16 | Ht 64.0 in | Wt 204.0 lb

## 2016-05-12 DIAGNOSIS — G43119 Migraine with aura, intractable, without status migrainosus: Secondary | ICD-10-CM

## 2016-05-12 DIAGNOSIS — Z3009 Encounter for other general counseling and advice on contraception: Secondary | ICD-10-CM

## 2016-05-12 DIAGNOSIS — N920 Excessive and frequent menstruation with regular cycle: Secondary | ICD-10-CM

## 2016-05-12 DIAGNOSIS — T148XXA Other injury of unspecified body region, initial encounter: Secondary | ICD-10-CM

## 2016-05-12 DIAGNOSIS — R7989 Other specified abnormal findings of blood chemistry: Secondary | ICD-10-CM

## 2016-05-12 DIAGNOSIS — F411 Generalized anxiety disorder: Secondary | ICD-10-CM

## 2016-05-12 DIAGNOSIS — M79644 Pain in right finger(s): Secondary | ICD-10-CM

## 2016-05-12 DIAGNOSIS — F17200 Nicotine dependence, unspecified, uncomplicated: Secondary | ICD-10-CM

## 2016-05-12 DIAGNOSIS — F339 Major depressive disorder, recurrent, unspecified: Secondary | ICD-10-CM

## 2016-05-12 DIAGNOSIS — R945 Abnormal results of liver function studies: Secondary | ICD-10-CM

## 2016-05-12 DIAGNOSIS — M778 Other enthesopathies, not elsewhere classified: Secondary | ICD-10-CM

## 2016-05-12 MED ORDER — MEDROXYPROGESTERONE ACETATE 150 MG/ML IM SUSY
150.0000 mg | PREFILLED_SYRINGE | INTRAMUSCULAR | Status: AC
  Administered 2016-06-03: 150 mg via INTRAMUSCULAR

## 2016-05-12 MED ORDER — CYCLOBENZAPRINE HCL 10 MG PO TABS
10.0000 mg | ORAL_TABLET | Freq: Three times a day (TID) | ORAL | 5 refills | Status: AC | PRN
Start: 2016-05-12 — End: ?

## 2016-05-12 MED ORDER — BUTALBITAL-APAP-CAFFEINE 50-325-40 MG PO TABS: 1.0000 | ORAL_TABLET | Freq: Four times a day (QID) | ORAL | 5 refills | Status: AC | PRN

## 2016-05-12 MED ORDER — IBUPROFEN 800 MG PO TABS: 800.0000 mg | ORAL_TABLET | Freq: Three times a day (TID) | ORAL | 11 refills | Status: AC | PRN

## 2016-05-12 MED ORDER — AMITRIPTYLINE HCL 100 MG PO TABS
100.0000 mg | ORAL_TABLET | Freq: Every day | ORAL | 3 refills | Status: AC
Start: 2016-05-12 — End: ?

## 2016-05-12 MED ORDER — FLUOXETINE HCL 20 MG PO CAPS: 60.0000 mg | ORAL_CAPSULE | Freq: Every day | ORAL | 11 refills | Status: AC

## 2016-05-12 MED ORDER — ONDANSETRON 4 MG PO TBDP: 4.0000 mg | ORAL_TABLET | Freq: Three times a day (TID) | ORAL | 5 refills | Status: AC | PRN

## 2016-05-12 MED ORDER — ALPRAZOLAM ER 0.5 MG PO TB24: 0.5000 mg | ORAL_TABLET | Freq: Every day | ORAL | 5 refills | Status: AC

## 2016-05-12 MED ORDER — ALPRAZOLAM 0.25 MG PO TABS
0.2500 mg | ORAL_TABLET | Freq: Two times a day (BID) | ORAL | 5 refills | Status: AC | PRN
Start: 2016-05-12 — End: ?

## 2016-05-12 MED ORDER — AMOXICILLIN 500 MG PO CAPS: 1000.0000 mg | ORAL_CAPSULE | Freq: Two times a day (BID) | ORAL | 0 refills | Status: DC

## 2016-05-12 NOTE — Patient Instructions (Signed)
     IF you received an x-ray today, you will receive an invoice from [facility]. Please contact [facility] at [phone] with questions or concerns regarding your invoice.   IF you received labwork today, you will receive an invoice from [facility]. Please contact [facility] at [phone] with questions or concerns regarding your invoice.   Our billing staff will not be able to assist you with questions regarding bills from these companies.  You will be contacted with the lab results as soon as they are available. The fastest way to get your results is to activate your My Chart account. Instructions are located on the last page of this paperwork. If you have not heard from us regarding the results in 2 weeks, please contact this office.     

## 2016-05-21 ENCOUNTER — Encounter: Payer: Self-pay | Admitting: Family Medicine

## 2016-05-27 ENCOUNTER — Telehealth: Payer: Self-pay | Admitting: Family Medicine

## 2016-05-27 MED ORDER — FLUCONAZOLE 150 MG PO TABS: 150.0000 mg | ORAL_TABLET | Freq: Once | ORAL | 0 refills | Status: AC

## 2016-05-27 NOTE — Telephone Encounter (Signed)
Pt is needing to get a diflucan called in for a yeast infection   Best number [phone]

## 2016-05-27 NOTE — Telephone Encounter (Signed)
Last seen 05/12/16  Ov?

## 2016-05-27 NOTE — Telephone Encounter (Signed)
Pt w/o insurance currently and I know her well so have sent in the diflucan. Thanks.

## 2016-05-29 ENCOUNTER — Other Ambulatory Visit: Payer: Self-pay | Admitting: Family Medicine

## 2016-06-02 ENCOUNTER — Other Ambulatory Visit: Payer: Self-pay

## 2016-06-03 ENCOUNTER — Other Ambulatory Visit (INDEPENDENT_AMBULATORY_CARE_PROVIDER_SITE_OTHER): Payer: Self-pay

## 2016-06-03 DIAGNOSIS — R945 Abnormal results of liver function studies: Principal | ICD-10-CM

## 2016-06-03 DIAGNOSIS — F411 Generalized anxiety disorder: Secondary | ICD-10-CM

## 2016-06-03 DIAGNOSIS — N920 Excessive and frequent menstruation with regular cycle: Secondary | ICD-10-CM

## 2016-06-03 DIAGNOSIS — F17200 Nicotine dependence, unspecified, uncomplicated: Secondary | ICD-10-CM

## 2016-06-03 DIAGNOSIS — R7989 Other specified abnormal findings of blood chemistry: Secondary | ICD-10-CM

## 2016-06-03 DIAGNOSIS — Z3009 Encounter for other general counseling and advice on contraception: Secondary | ICD-10-CM

## 2016-06-03 LAB — POCT URINE PREGNANCY: Preg Test, Ur: NEGATIVE

## 2016-06-03 NOTE — Patient Instructions (Signed)
     IF you received an x-ray today, you will receive an invoice from [facility]. Please contact  Radiology at [phone] with questions or concerns regarding your invoice.   IF you received labwork today, you will receive an invoice from [facility]. Please contact [facility] at [phone] with questions or concerns regarding your invoice.   Our billing staff will not be able to assist you with questions regarding bills from these companies.  You will be contacted with the lab results as soon as they are available. The fastest way to get your results is to activate your My Chart account. Instructions are located on the last page of this paperwork. If you have not heard from us regarding the results in 2 weeks, please contact this office.     

## 2016-06-05 NOTE — Progress Notes (Signed)
Lab only 

## 2016-07-28 ENCOUNTER — Encounter: Payer: Self-pay | Admitting: Family Medicine

## 2016-08-08 ENCOUNTER — Other Ambulatory Visit: Payer: Self-pay

## 2017-01-12 ENCOUNTER — Encounter: Payer: Self-pay | Admitting: Family Medicine

## 2017-03-23 IMAGING — CR DG ABDOMEN ACUTE W/ 1V CHEST
4 series · 4 of 4 positions shown · non-contrast
Comparison: None.

CLINICAL DATA: 33-year-old with acute onset of generalized
abdominal pain, abdominal bloating and diarrhea.

EXAM:
DG ABDOMEN ACUTE W/ 1V CHEST

[PA]
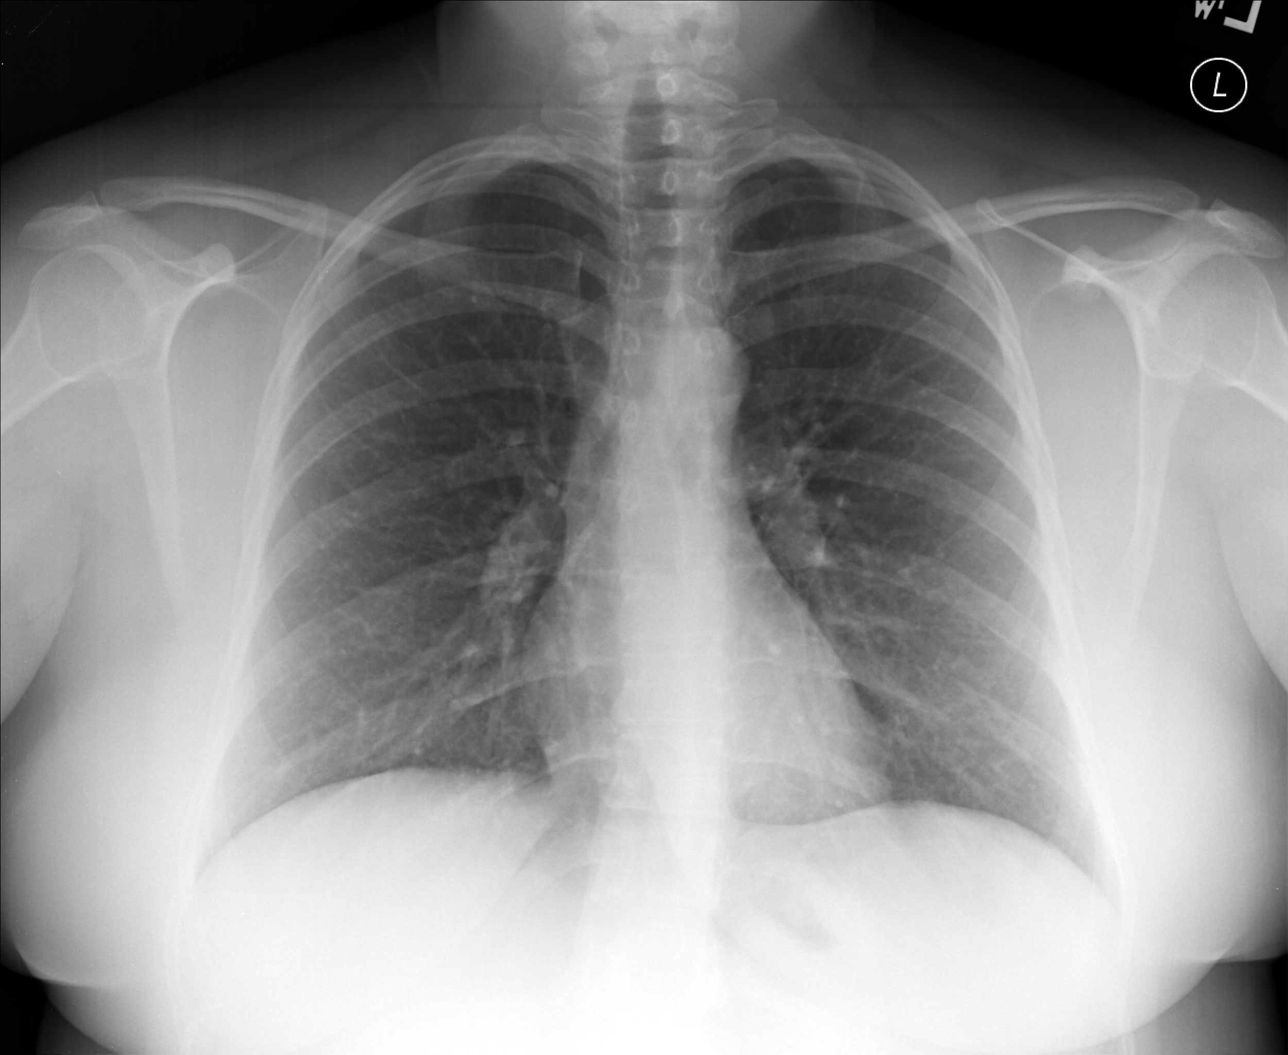

[AP (1 of 3)]
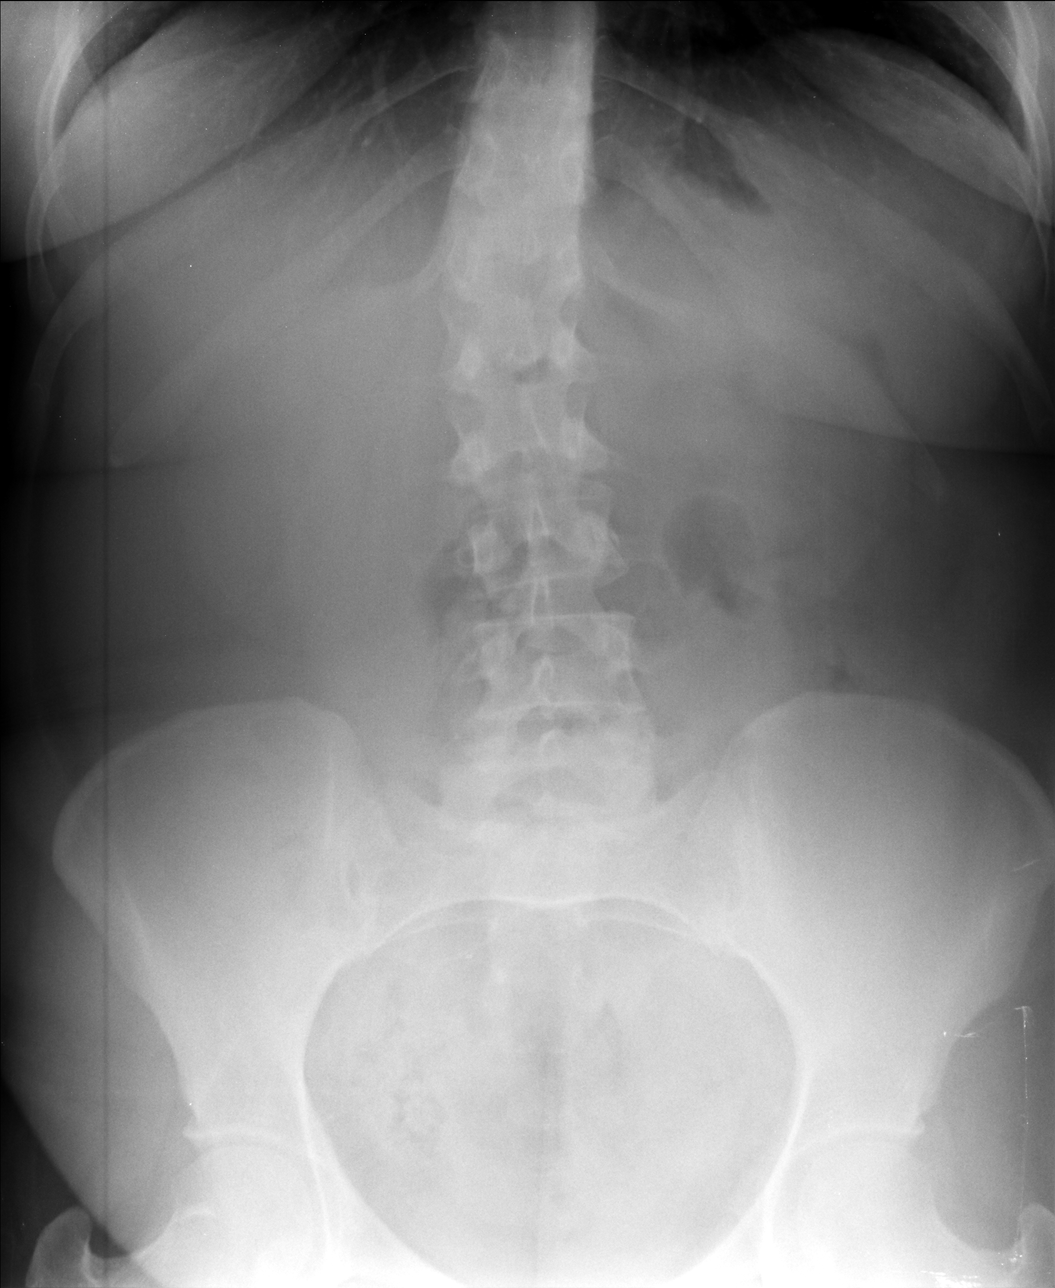

[AP (2 of 3)]
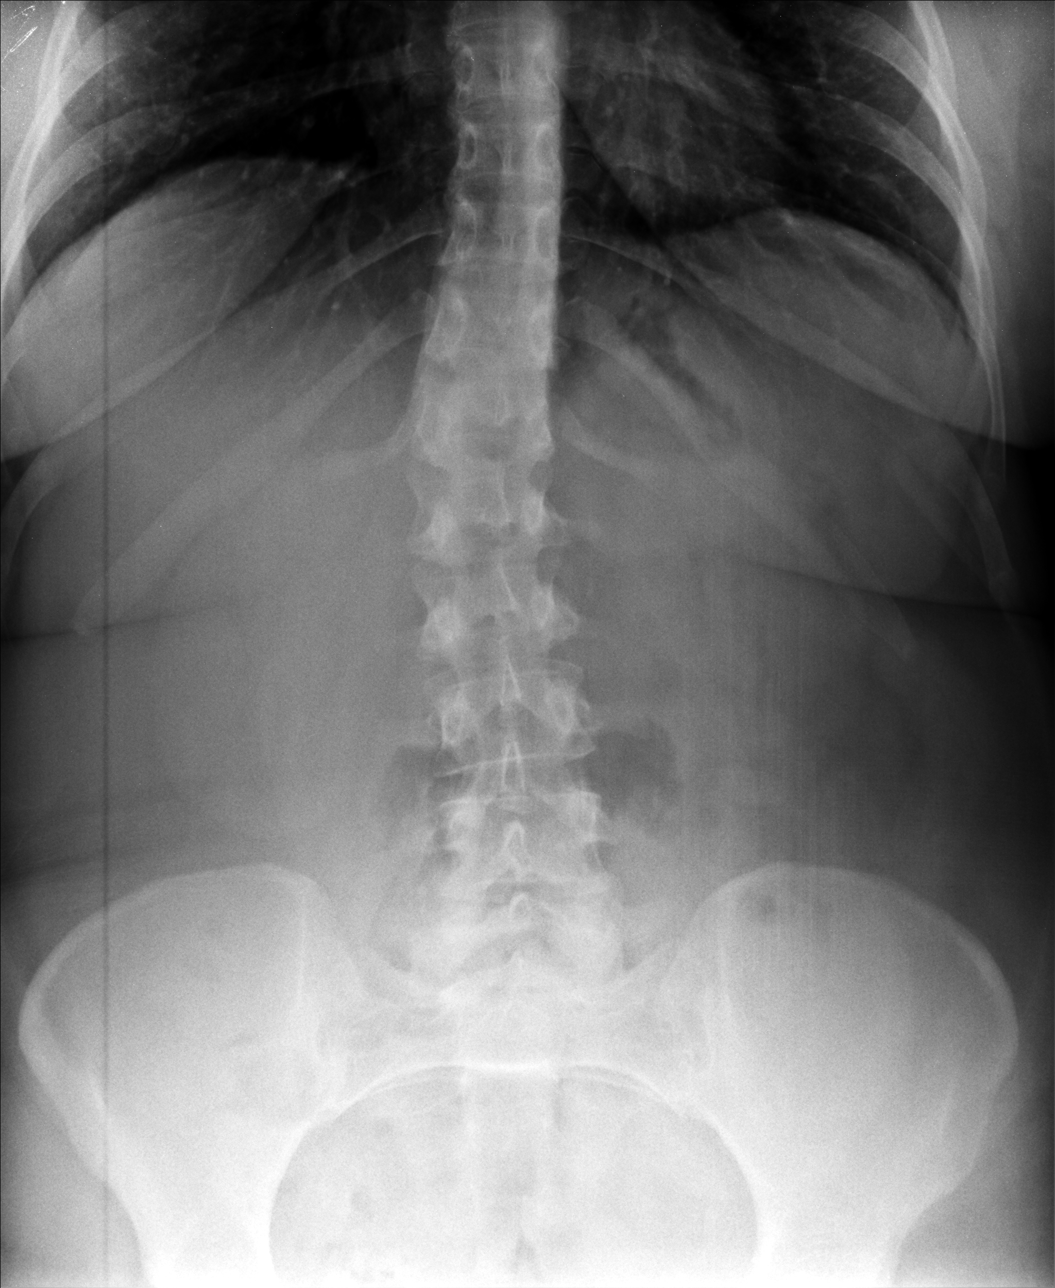

[AP (3 of 3)]
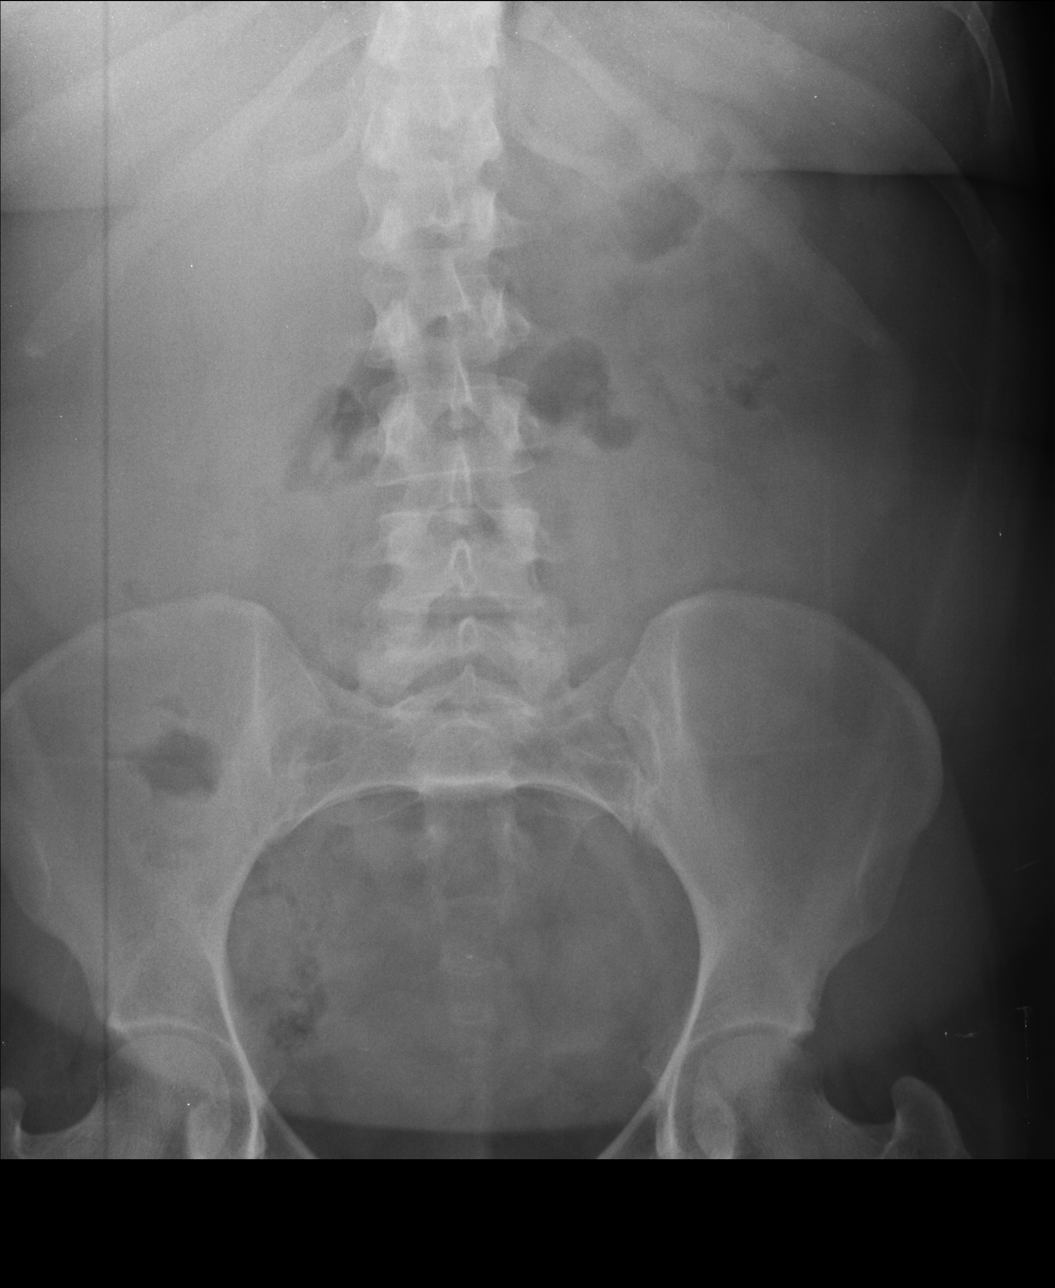

[4 of 4 positions shown; findings below may reference images not displayed]

FINDINGS: Bowel gas pattern unremarkable without evidence of obstruction or
significant ileus. No evidence of free air or significant air-fluid
levels on the erect image. Expected stool burden in the colon. No
visible opaque urinary tract calculi. Mild degenerative changes
involving the lower lumbar spine. Slight thoracolumbar scoliosis
convex right.

Cardiomediastinal silhouette unremarkable. Lungs clear.
Bronchovascular markings normal. Pulmonary vascularity normal. No
visible pleural effusions. No pneumothorax.
IMPRESSION: No acute abdominal or pulmonary abnormalities.

## 2017-07-18 IMAGING — CR DG ELBOW COMPLETE 3+V*R*
3 series · 3 of 3 positions shown · non-contrast
Comparison: None.

CLINICAL DATA: 34-year-old female with right elbow pain for 1 day.

EXAM:
RIGHT ELBOW - COMPLETE 3+ VIEW

[AP]
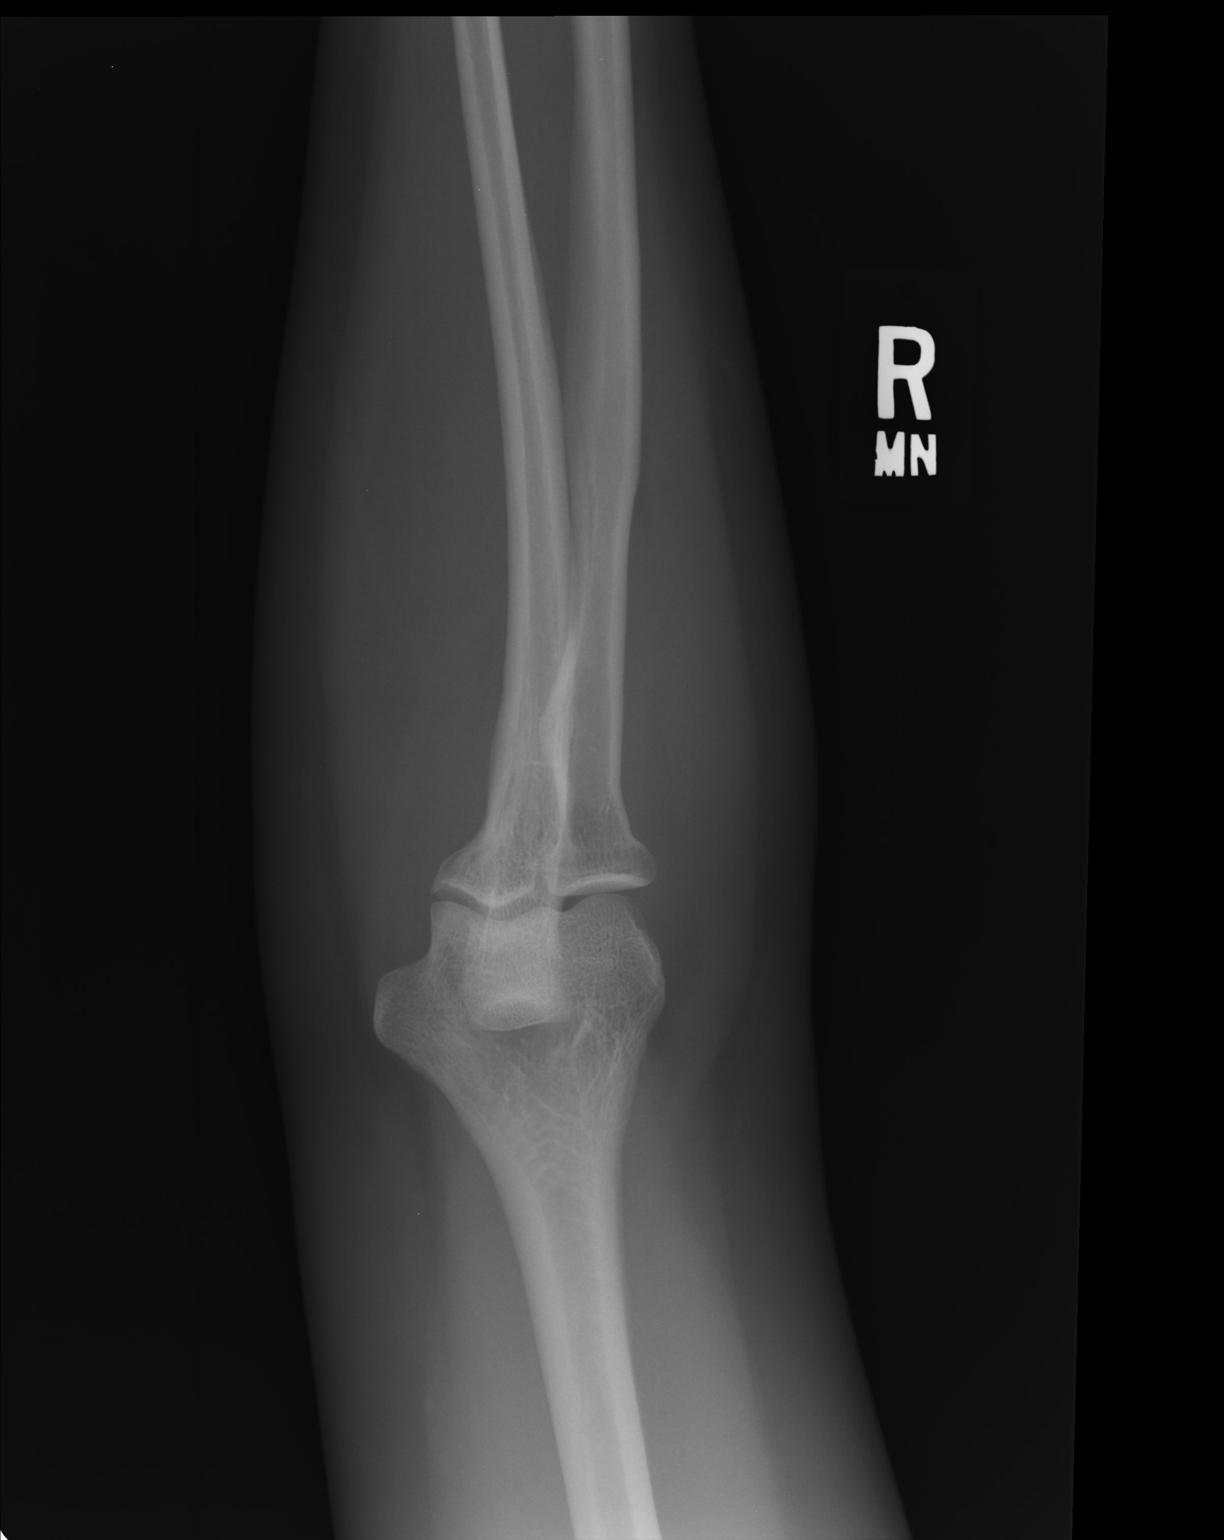

[ap obl ext rot]
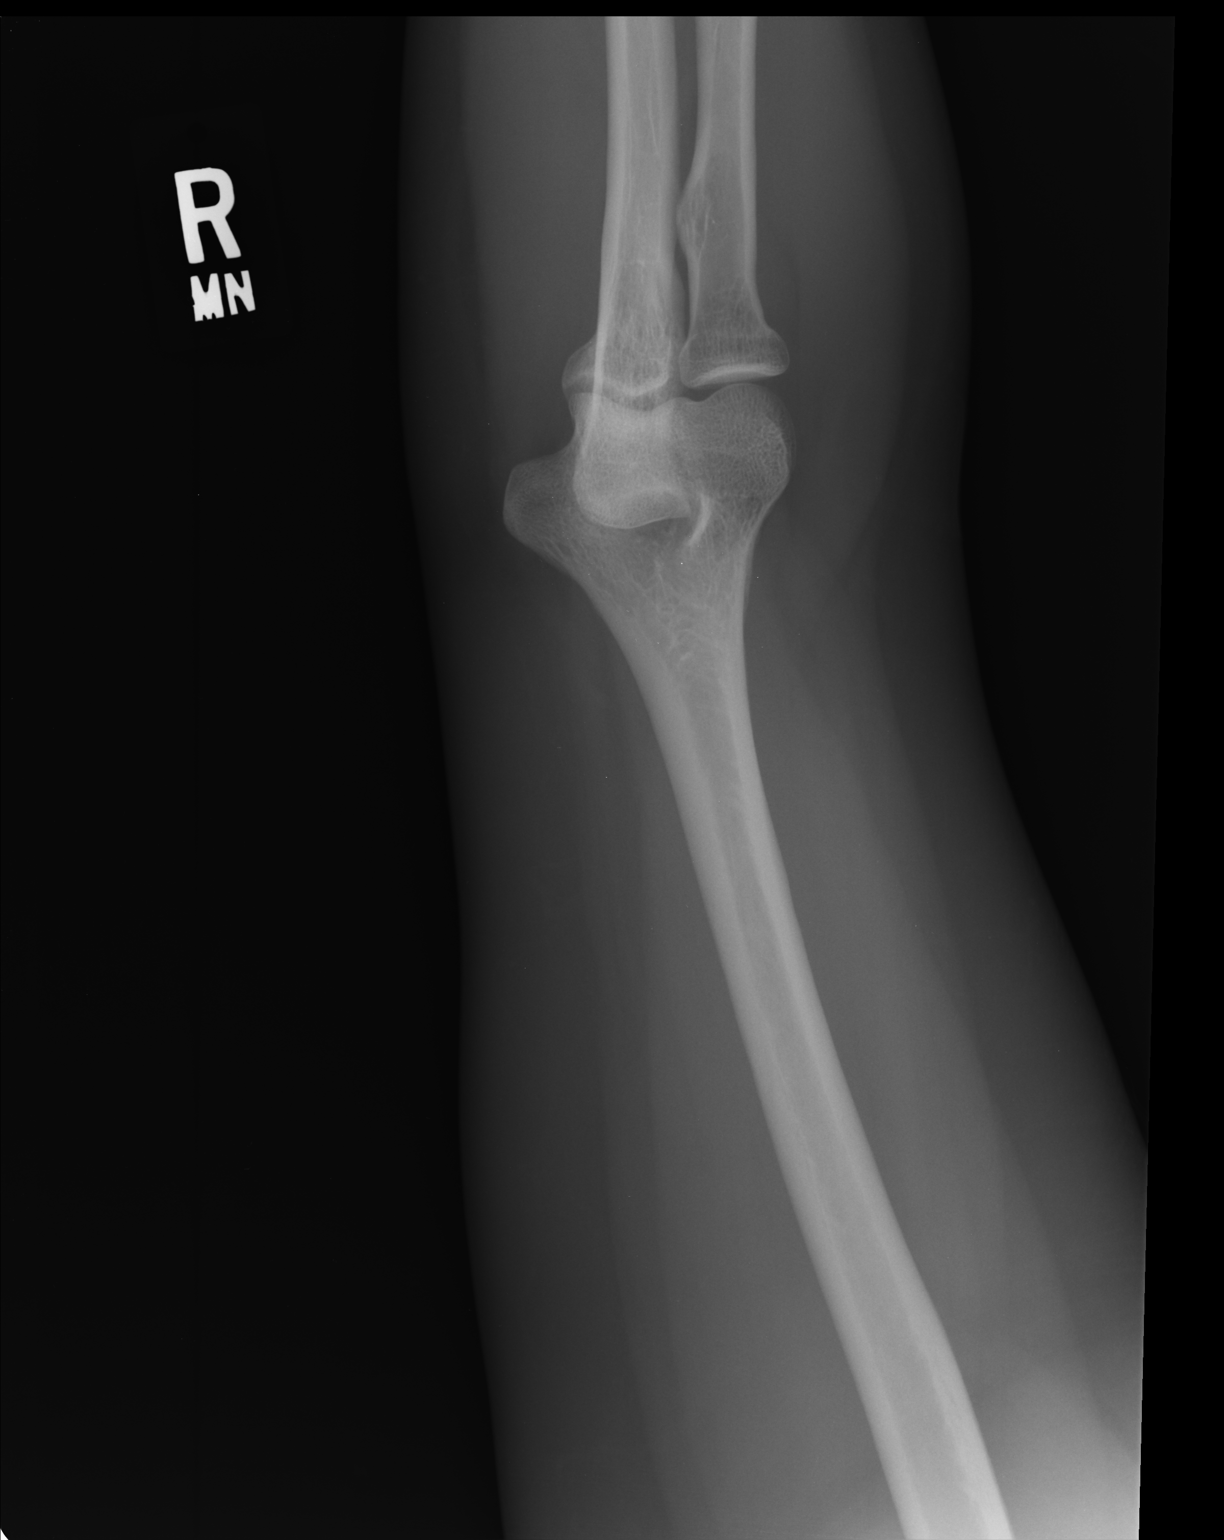

[lateral]
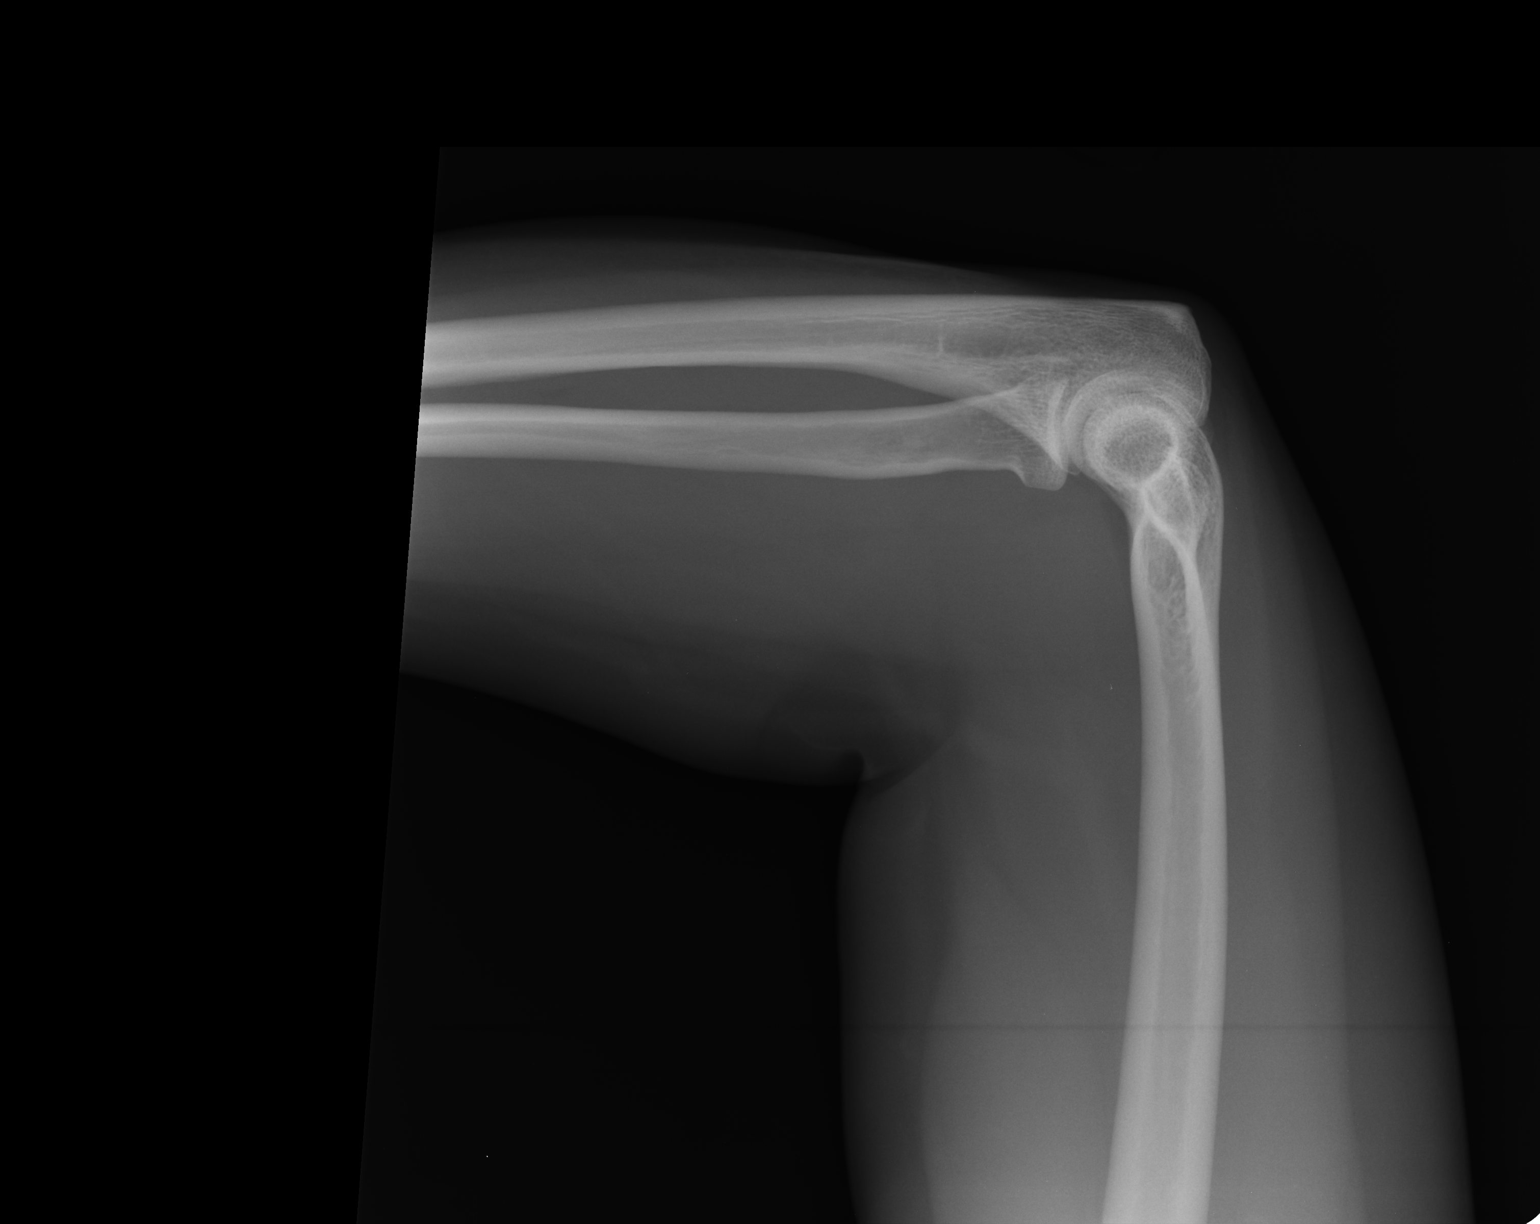

[3 of 3 positions shown; findings below may reference images not displayed]

FINDINGS: There is no evidence of fracture, dislocation, or joint effusion.
There is no evidence of arthropathy or other focal bone abnormality.
Soft tissues are unremarkable.
IMPRESSION: Negative.

## 2024-03-22 ENCOUNTER — Ambulatory Visit: Admission: RE | Admit: 2024-03-22 | Discharge: 2024-03-22 | Discharge status: Against Medical Advice | Payer: Self-pay
